# Patient Record
Sex: Male | Born: 1965
Health system: Southern US, Community
[De-identification: ages and names within clinical notes are randomized; demographics above are authoritative.]

## PROBLEM LIST (undated history)

## (undated) DIAGNOSIS — K219 Gastro-esophageal reflux disease without esophagitis: Secondary | ICD-10-CM

## (undated) DIAGNOSIS — E7800 Pure hypercholesterolemia, unspecified: Secondary | ICD-10-CM

## (undated) DIAGNOSIS — G4730 Sleep apnea, unspecified: Secondary | ICD-10-CM

## (undated) DIAGNOSIS — E785 Hyperlipidemia, unspecified: Secondary | ICD-10-CM

## (undated) HISTORY — DX: Hyperlipidemia, unspecified: E78.5

## (undated) HISTORY — PX: NO PAST SURGERIES: SHX2092

---

## 1998-09-12 ENCOUNTER — Ambulatory Visit: Admission: RE | Admit: 1998-09-12 | Discharge: 1998-09-12 | Payer: Self-pay | Admitting: Otolaryngology

## 2005-09-17 ENCOUNTER — Ambulatory Visit (HOSPITAL_COMMUNITY): Admission: RE | Admit: 2005-09-17 | Discharge: 2005-09-17 | Payer: Self-pay | Admitting: General Surgery

## 2007-07-29 ENCOUNTER — Ambulatory Visit (HOSPITAL_BASED_OUTPATIENT_CLINIC_OR_DEPARTMENT_OTHER): Admission: RE | Admit: 2007-07-29 | Discharge: 2007-07-29 | Payer: Self-pay | Admitting: Internal Medicine

## 2007-08-05 ENCOUNTER — Ambulatory Visit: Payer: Self-pay | Admitting: Internal Medicine

## 2007-10-11 ENCOUNTER — Ambulatory Visit (HOSPITAL_BASED_OUTPATIENT_CLINIC_OR_DEPARTMENT_OTHER): Admission: RE | Admit: 2007-10-11 | Discharge: 2007-10-11 | Payer: Self-pay | Admitting: Internal Medicine

## 2007-10-16 ENCOUNTER — Ambulatory Visit: Payer: Self-pay | Admitting: Internal Medicine

## 2008-05-08 ENCOUNTER — Encounter: Admission: RE | Admit: 2008-05-08 | Discharge: 2008-05-08 | Payer: Self-pay | Admitting: General Surgery

## 2008-12-30 ENCOUNTER — Inpatient Hospital Stay (HOSPITAL_COMMUNITY): Admission: EM | Admit: 2008-12-30 | Discharge: 2009-01-02 | Payer: Self-pay | Admitting: Family Medicine

## 2008-12-30 ENCOUNTER — Ambulatory Visit: Payer: Self-pay | Admitting: Family Medicine

## 2009-01-01 ENCOUNTER — Encounter: Payer: Self-pay | Admitting: Family Medicine

## 2009-11-13 ENCOUNTER — Emergency Department (HOSPITAL_COMMUNITY): Admission: EM | Admit: 2009-11-13 | Discharge: 2009-11-13 | Payer: Self-pay | Admitting: Emergency Medicine

## 2010-10-20 LAB — COMPREHENSIVE METABOLIC PANEL
ALT: 120 U/L — ABNORMAL HIGH (ref 0–53)
ALT: 91 U/L — ABNORMAL HIGH (ref 0–53)
AST: 48 U/L — ABNORMAL HIGH (ref 0–37)
AST: 51 U/L — ABNORMAL HIGH (ref 0–37)
Albumin: 3.2 g/dL — ABNORMAL LOW (ref 3.5–5.2)
Alkaline Phosphatase: 66 U/L (ref 39–117)
Alkaline Phosphatase: 68 U/L (ref 39–117)
Alkaline Phosphatase: 72 U/L (ref 39–117)
BUN: 12 mg/dL (ref 6–23)
Calcium: 8.2 mg/dL — ABNORMAL LOW (ref 8.4–10.5)
Calcium: 8.6 mg/dL (ref 8.4–10.5)
Chloride: 102 mEq/L (ref 96–112)
Chloride: 104 mEq/L (ref 96–112)
Creatinine, Ser: 1.1 mg/dL (ref 0.4–1.5)
Creatinine, Ser: 1.16 mg/dL (ref 0.4–1.5)
Creatinine, Ser: 1.2 mg/dL (ref 0.4–1.5)
GFR calc Af Amer: >60 mL/min (ref >60)
GFR calc Af Amer: >60 mL/min (ref >60)
GFR calc Af Amer: >60 mL/min (ref >60)
GFR calc non Af Amer: >60 mL/min (ref >60)
GFR calc non Af Amer: >60 mL/min (ref >60)
Glucose, Bld: 121 mg/dL — ABNORMAL HIGH (ref 70–99)
Potassium: 3.5 mEq/L (ref 3.5–5.1)
Potassium: 3.9 mEq/L (ref 3.5–5.1)
Sodium: 136 mEq/L (ref 135–145)
Total Bilirubin: 0.6 mg/dL (ref 0.3–1.2)
Total Bilirubin: 0.7 mg/dL (ref 0.3–1.2)
Total Protein: 7.1 g/dL (ref 6.0–8.3)
Total Protein: 7.4 g/dL (ref 6.0–8.3)

## 2010-10-20 LAB — CBC
Hemoglobin: 12.8 g/dL — ABNORMAL LOW (ref 13.0–17.0)
MCHC: 34.2 g/dL (ref 30.0–36.0)
MCV: 86.6 fL (ref 78.0–100.0)
Platelets: 177 10*3/uL (ref 150–400)
RDW: 13 % (ref 11.5–15.5)
RDW: 13.1 % (ref 11.5–15.5)
WBC: 8.2 10*3/uL (ref 4.0–10.5)

## 2010-10-20 LAB — CULTURE, BLOOD (ROUTINE X 2)
Culture: NO GROWTH
Culture: NO GROWTH

## 2010-10-20 LAB — DIFFERENTIAL
Monocytes Absolute: 1 10*3/uL (ref 0.1–1.0)
Monocytes Relative: 12 % (ref 3–12)

## 2010-11-25 NOTE — H&P (Signed)
NAMECLIFORD, [name]                ACCOUNT NO.:  [id number]   MEDICAL RECORD NO.:  [id number]          PATIENT TYPE:  INP   LOCATION:  3310                         FACILITY:  [facility]   PHYSICIAN:  Nestor Ramp, MD        DATE OF BIRTH:  Apr 19, 1966   DATE OF ADMISSION:  12/30/2008  DATE OF DISCHARGE:                              HISTORY & PHYSICAL   PRIMARY CARE PHYSICIAN:  Clydie Braun L. Hal Hope, MD, at [facility].   CHIEF COMPLAINT:  Fever and rash.   HISTORY OF PRESENT ILLNESS:  This patient is a 45 year old gentleman  with past medical history of hyperlipidemia that presents with fever,  chills, and weakness.  He also endorses a 1-day history of diffuse rash  and left knee arthralgia.  The patient works outdoors, moving lawns but  has not noted any recent insect or tick bite.  Family endorses many  mosquitoes at home.  The patient complains of a headache with mild neck  stiffness.  He has no vision changes, no altered mental status, although  he says he feels slightly confused.  He does answer questions  appropriately, and his family says that he have not noted any mental  status changes.  He did have 1 episode of nonbilious, nonbloody vomit x1  yesterday.  He denies diarrhea, constipation, or urinary changes.  He  also denies abdominal pain.  The patient was seen at [facility] yesterday  and today, he was diagnosed with a possible pneumonia, although the  patient reports minimal cough.  The patient was admitted to [facility]  for likelihood of [facility] spotted fever.  He was started on doxy  yesterday at [facility] but returned today feeling slightly  worse and with an elevated temperature at 103.   PAST MEDICAL HISTORY:  Hyperlipidemia.   PAST SURGICAL HISTORY:  None.   MEDICATIONS:  Simvastatin.   ALLERGIES:  No known drug allergies.   SOCIAL HISTORY:  The patient lives in [place] with his family.  He  quit smoking 2 years ago, but prior to that, he did  smoke one-half pack  per day x20 years.  He admits to 4- to 5-beer per week and denies drug  use.   Family history is significant for hypertension.   REVIEW OF SYSTEMS:  The patient does endorse fever, chills, fatigue,  headache, cough, nausea, rash, arthralgias, and dizziness.  He denies  sore throat, ear pain, rhinorrhea, chest pain, edema, orthopnea,  dyspnea, wheezing, sputum production, vomiting, diarrhea, vision  changes, polyuria, and polydipsia.   PHYSICAL EXAMINATION:  VITAL SIGNS:  Temperature 100.2, pulse 90,  respirations 18, blood pressure 125/63, pulse ox 97% on 2 L.  GENERAL:  He is alert and oriented x3 in no apparent distress.  HEENT:  Normocephalic, atraumatic.  Pupils are equally round and  reactive to light.  Extraocular muscles are intact.  Oropharynx is pink  with no lesions.  Mucous membranes are moist.  NECK:  He does have full range of motion of his neck and it is supple.  CARDIOVASCULAR:  Regular rate and  rhythm.  No murmurs, rubs, or gallops.  LUNGS:  Mostly clear with slight bibasilar crackles.  ABDOMEN:  Soft, nontender, and nondistended with bowel sounds x4.  SKIN:  The patient does have a papular blanchable rash diffusely on his  back and lower extremities.  EXTREMITIES:  There is no cyanosis, clubbing, or edema.  He does have a  left knee swelling without effusion.  No crepitus.  NEURO:  Cranial nerves II through XII are intact.  No known focal  deficits.  MUSCULOSKELETAL:  Strength is normal.   LABORATORY DATA:  UA was negative for nitrites and leukocytes, did have  100 protein and trace ketones.  White blood cell count 6.8, hemoglobin  13.7, hematocrit 42.7, and platelets 207.   ASSESSMENT/PLAN:  This is a 45 year old previously healthy gentleman  with new onset of fever x4 days accompanied by rash, arthralgias,  headache x1 day, and possible tick exposure.  1. Fever, rash.  It is likely this is [facility] spotted fever      given the  patient's story of being outside cutting grass last week      and now with fever, chills, malaise, and rash.  We will admit him      for observation overnight for signs of worsening disease.  We will      treat him with IV doxycycline, IV fluids and Tylenol/ibuprofen for      pain and fever.  We will obtained blood cultures x2 and check chest      x-ray.  It is unlikely that the patient does have pneumonia, as he      has no productive cough and no white blood cell count.  We will      check a CBC with diff and a CMP in the morning.  2. Arthralgia, left greater than right.  We will monitor this as well      as check an ESR and CRP.  3. Hyperlipidemia.  To pull the patient's statin for now, as we do not      know his dose.  4. Epigastric pain after vomiting.  We will give the patient Protonix      40 mg p.o. b.i.d.  5. Fluids, electrolytes, nutrition and gastrointestinal.  The patient      endorses good appetite with no more nausea, so he will be able to      have a regular diet with maintenance IV fluids.  6. Prophylaxis.  Protonix and heparin.  7. Disposition.  Pending workup and improvement.      Helane Rima, MD  Electronically Signed      Nestor Ramp, MD  Electronically Signed    EW/MEDQ  D:  12/30/2008  T:  12/31/2008  Job:  [id number]

## 2010-11-25 NOTE — Procedures (Signed)
NAMEPETR, [name]                ACCOUNT NO.:  [id number]   MEDICAL RECORD NO.:  [id number]          PATIENT TYPE:  OUT   LOCATION:  SLEEP CENTER                 FACILITY[facility]  [facility]   PHYSICIAN:  Clinton D. Maple Hudson, MD, FCCP, FACPDATE OF BIRTH:  09[birth year]Sep[birth year]1967   DATE OF STUDY:  07/29/2007                            NOCTURNAL POLYSOMNOGRAM   REFERRING PHYSICIAN:  Peyton Najjar, MD   INDICATION FOR STUDY:  Hypersomnia with sleep apnea.   EPWORTH SLEEPINESS SCORE:  6/24, BMI 27, weight 180 pounds, height 68  inches, neck 16 inches.   MEDICATIONS:  Home medication charted and reviewed.   SLEEP ARCHITECTURE:  Total sleep time 386 minutes.  Sleep efficiency  96.5%.  Stage I was 1.9%, stage II 61.7%, stage III absent, REM 36.4% of  total sleep time.  Sleep latency 9 minutes, REM latency 3 minutes, awake  after sleep onset 2 minutes, arousal index 13.8.  No bedtime medication  was taken.   RESPIRATORY DATA:  Diagnostic NPSG protocol.  Apnea/hypopnea index (AHI)  62.8 events per hour indicating severe obstructive sleep apnea/hypopnea  syndrome.  A total of 404 respiratory events were scored including 230  obstructive apneas, 17 central apneas, 22 mixed apneas and 135  hypopneas.  Events were not positional.  REM AHI 57.7.  CPAP titration  was not done as per diagnostic NPSG protocol.   OXYGEN DATA:  Loud snoring with oxygen desaturation to a nadir of 70%.  Mean oxygen saturation through the study was 92.5% on room air.   CARDIAC DATA:  Rhythm was frequent PACs.   MOVEMENT-PARASOMNIA:  No significant movement disturbance.  No bathroom  trips.   IMPRESSIONS-RECOMMENDATIONS:  1. Severe obstructive sleep apnea/hypopnea syndrome, AHI 62.8 per hour      with nonpositional events, loud snoring and oxygen desaturation to      a nadir of 70%.  2. Consider early return for CPAP titration or evaluate for      alternative therapies as appropriate.     Clinton D. Maple Hudson, MD, [facility], FACP  Diplomate, Biomedical engineer of Sleep Medicine  Electronically Signed    CDY/MEDQ  D:  08/06/2007 10:46:23  T:  08/07/2007 06:46:10  Job:  [id number]

## 2010-11-28 NOTE — Procedures (Signed)
[name], [name]                ACCOUNT NO.:  [id number]   MEDICAL RECORD NO.:  [id number]          PATIENT TYPE:  OUT   LOCATION:  SLEEP CENTER                 FACILITY:  [facility]   PHYSICIAN:  Clinton D. Maple Hudson, MD, FCCP, FACPDATE OF BIRTH:  [birth year]07[birth year]1967   DATE OF STUDY:                            NOCTURNAL POLYSOMNOGRAM   REFERRING PHYSICIAN:  Peyton Najjar, MD   INDICATION FOR STUDY:  Hypersomnia with sleep apnea.   EPWORTH SLEEPINESS SCORE:  4/24.   BMI 29.6.  Weight 189 pounds.  Height 67 inches.  Neck 16 inches.   HOME MEDICATIONS:  Charted and reviewed.   A baseline diagnostic NPSG on July 29, 2007 recorded an AHI of 62.8  per hour.  CPAP titration is requested.   SLEEP ARCHITECTURE:  Total sleep time 335 minutes with sleep efficiency  79.9%.  Stage I was 16.1%.  Stage II 71.9%.  Stage III absent.  REM  11.9% of total sleep time.  Sleep latency 13.5 minutes.  REM latency 190  minutes.  Awake after sleep onset 72.5 minutes.  Arousal index 14.1.  No bedtime medication was taken.   RESPIRATORY DATA:  CPAP titration protocol.  CPAP was titrated to 14  CWP, AHI 3.3 per hour.  He chose a medium Quattro full-face mask with  heated humidifier.   OXYGEN DATA:  Mean oxygen saturation was held at 97.6% on room air with  CPAP.   CARDIAC DATA:  Normal sinus rhythm.   MOVEMENT-PARASOMNIA:  No significant movement disturbance.  No bathroom  trips.   IMPRESSIONS-RECOMMENDATIONS:  1. Successful CPAP titration to 14 CWP, AHI 3.3 per hour.  He chose a      medium Quattro full-face mask with      heated humidifier.  2. Baseline diagnostic NPSG on July 29, 2007 recorded an AHI of      62.8 per hour.      Clinton D. Maple Hudson, MD, [facility], FACP  Diplomate, Biomedical engineer of Sleep Medicine  Electronically Signed     CDY/MEDQ  D:  10/15/2007 14:23:15  T:  10/15/2007 15:23:14  Job:  [id number]

## 2010-11-28 NOTE — Discharge Summary (Signed)
NAMESANDFORD, DIOP                ACCOUNT NO.:  [id number]   MEDICAL RECORD NO.:  [id number]          PATIENT TYPE:  INP   LOCATION:  2024                         FACILITY:  [facility]   PHYSICIAN:  Paula Compton, MD        DATE OF BIRTH:  1966/06/28   DATE OF ADMISSION:  12/30/2008  DATE OF DISCHARGE:  01/02/2009                               DISCHARGE SUMMARY   PRIMARY CARE PHYSICIAN:  Clydie Braun L. Hal Hope, MD, at [facility].   DISCHARGE DIAGNOSES:  1. [facility] spotted fever.  2. Hyperlipidemia.   DISCHARGE MEDICATIONS:  1. Doxycycline 100 mg by mouth twice daily, finish 10-day course.  2. Simvastatin previous dose.  3. The patient was also instructed to take Tylenol and ibuprofen for      fever over 100.4.   IMAGES:  On January 01, 2009, impression, no focal infiltrate or effusion.  The heart is in within normal limits in size.  No significant  degenerative changes seen.   LABORATORY DATA:  Blood cultures x2 showed no growth x5 days.  BMP  within normal limits with the exception of slightly elevated AST at 51  and ALT at 79.  Hemoglobin 12.8, hematocrit 38.5, CRP 13.1, BNP less  than 30, and ESR 41.   An echo on January 01, 2009, systolic function normal with an estimated  ejection fraction of 60-65%.   BRIEF HOSPITAL COURSE:  The patient is a pleasant 45 year old gentleman  with a past medical history significant for hyperlipidemia that  presented with fever, chills, weakness, and a diffuse rash consistent  with [facility] spotted fever.  He was admitted due to worsening  fever and symptoms and although he had already been taking doxycycline.  Please see H and P for further details.   [facility] spotted fever.  The patient's history and physical were  consistent with [facility] spotted fever.  The patient was admitted  for treatment with IV doxycycline, IV fluids, and Tylenol/ibuprofen for  pain and fever.  Blood cultures were obtained that showed no growth  x5  days, and a chest x-ray was obtained to evaluate for the possibility of  a pneumonia.  The initial chest x-ray on December 30, 2008, did show  cardiomegaly and a left lower lobe infiltrate.  Because of this, the  patient was also treated with ceftriaxone in case he did have a  unrelated pneumonia on top of his [facility] spotted fever.  He also  underwent an echo that showed a normal ejection fraction.  Subsequent  chest x-ray on January 01, 2009, showed no focal infiltrate or effusion and  also showed that the heart was within normal limits in size.  The  patient did continue to have fever spikes up to 103 prior to discharge,  but he was clinically feeling and looking much improved.  He was  educated about the natural course of [facility] spotted fever and  the fact that he may continue to have fevers for several more days.  He  was instructed to continue his course of doxycycline and to treat  his  fever with Tylenol and ibuprofen.  Prior to admission, it was noted that  his rash was improving and that he was taking good p.o. and admitting  that he felt much improved.  A punch biopsy had been planned to confirm  [facility] spotted fever; however, because the patient's rash was  improving so quickly, the punch biopsy did not deem necessary.  It was  also noted Dr. Hal Hope at [facility] did obtain an IgM to  confirm [facility] spotted fever.   FOLLOWUP:  The patient was instructed to follow up with Dr. Hal Hope at  [facility] in 1 week, and he was also given red flags that would  prompt and seek medical attention quickly.      Helane Rima, MD  Electronically Signed      Paula Compton, MD  Electronically Signed    EW/MEDQ  D:  01/06/2009  T:  01/07/2009  Job:  [id number]

## 2011-07-28 ENCOUNTER — Ambulatory Visit (INDEPENDENT_AMBULATORY_CARE_PROVIDER_SITE_OTHER): Payer: BC Managed Care – PPO

## 2011-07-28 DIAGNOSIS — R079 Chest pain, unspecified: Secondary | ICD-10-CM

## 2011-07-28 DIAGNOSIS — R0602 Shortness of breath: Secondary | ICD-10-CM

## 2011-07-28 DIAGNOSIS — R635 Abnormal weight gain: Secondary | ICD-10-CM

## 2011-07-28 DIAGNOSIS — F809 Developmental disorder of speech and language, unspecified: Secondary | ICD-10-CM

## 2011-11-30 ENCOUNTER — Ambulatory Visit (INDEPENDENT_AMBULATORY_CARE_PROVIDER_SITE_OTHER): Payer: BC Managed Care – PPO | Admitting: Family Medicine

## 2011-11-30 VITALS — BP 126/80 | HR 63 | Temp 98.0°F | Resp 16 | Ht 67.5 in | Wt 193.0 lb

## 2011-11-30 DIAGNOSIS — Z0000 Encounter for general adult medical examination without abnormal findings: Secondary | ICD-10-CM

## 2011-11-30 DIAGNOSIS — E785 Hyperlipidemia, unspecified: Secondary | ICD-10-CM

## 2011-11-30 LAB — COMPREHENSIVE METABOLIC PANEL WITH GFR
ALT: 34 U/L (ref 0–53)
AST: 22 U/L (ref 0–37)
Albumin: 4.8 g/dL (ref 3.5–5.2)
BUN: 13 mg/dL (ref 6–23)
Calcium: 9.8 mg/dL (ref 8.4–10.5)
Total Protein: 7.8 g/dL (ref 6.0–8.3)

## 2011-11-30 LAB — LIPID PANEL
Cholesterol: 137 mg/dL (ref 0–200)
HDL: 45 mg/dL (ref >39)
LDL Cholesterol: 72 mg/dL (ref 0–99)
Total CHOL/HDL Ratio: 3 Ratio
Triglycerides: 100 mg/dL (ref <150)
VLDL: 20 mg/dL (ref 0–40)

## 2011-11-30 LAB — POCT CBC
Granulocyte percent: 79.2 % (ref 37–80)
HCT, POC: 42.9 % — AB (ref 43.5–53.7)
Hemoglobin: 14.5 g/dL (ref 14.1–18.1)
Lymph, poc: 1.5 (ref 0.6–3.4)
MCH, POC: 29.1 pg (ref 27–31.2)
MCHC: 33.8 g/dL (ref 31.8–35.4)
MCV: 86 fL (ref 80–97)
MID (cbc): 0.5 (ref 0–0.9)
MPV: 10.2 fL (ref 0–99.8)
POC Granulocyte: 7.7 — AB (ref 2–6.9)
POC LYMPH PERCENT: 15.2 % (ref 10–50)
POC MID %: 5.6 % (ref 0–12)
Platelet Count, POC: 240 10*3/uL (ref 142–424)
RBC: 4.99 M/uL (ref 4.69–6.13)
RDW, POC: 14.1 %
WBC: 9.7 10*3/uL (ref 4.6–10.2)

## 2011-11-30 LAB — COMPREHENSIVE METABOLIC PANEL
Alkaline Phosphatase: 62 U/L (ref 39–117)
CO2: 28 mEq/L (ref 19–32)
Chloride: 106 mEq/L (ref 96–112)
Creat: 1.13 mg/dL (ref 0.50–1.35)
Glucose, Bld: 82 mg/dL (ref 70–99)
Potassium: 4.7 mEq/L (ref 3.5–5.3)
Sodium: 141 mEq/L (ref 135–145)
Total Bilirubin: 0.5 mg/dL (ref 0.3–1.2)

## 2011-11-30 LAB — IFOBT (OCCULT BLOOD): IFOBT: NEGATIVE

## 2011-11-30 MED ORDER — SIMVASTATIN 20 MG PO TABS: 20.0000 mg | ORAL_TABLET | Freq: Every evening | ORAL | Status: DC

## 2011-11-30 NOTE — Progress Notes (Signed)
[facility]:  Office Visit  Chief Complaint:  Chief Complaint  Patient presents with  . Heartburn    x years  . Constipation  . Annual Exam    Snores, Apnea    HPI: [name] is a 46 y.o. male who complains of: 1. CPE-doing well 2. Sleep apnea-using CPAP 3. GERD not on meds 4. Hyperlipidemia-takes medications regularly, no SEs   Past Medical History  Diagnosis Date  . Hyperlipidemia    History reviewed. No pertinent past surgical history. History   Social History  . Marital Status: Married    Spouse Name: N/A    Number of Children: N/A  . Years of Education: N/A   Social History Main Topics  . Smoking status: Former Smoker    Quit date: 11/30/2006  . Smokeless tobacco: None  . Alcohol Use: No  . Drug Use: No  . Sexually Active: None   Other Topics Concern  . None   Social History Narrative  . None   Family History  Problem Relation Age of Onset  . Thyroid disease Mother   . Hyperlipidemia Father   . Hypertension Father   . Stroke Father    No Known Allergies Prior to Admission medications   Medication Sig Start Date End Date Taking? Authorizing Provider  simvastatin (ZOCOR) 20 MG tablet Take 20 mg by mouth every evening.   Yes Historical Provider, MD     ROS: The patient denies fevers, chills, night sweats, unintentional weight loss, chest pain, palpitations, wheezing, dyspnea on exertion, nausea, vomiting, abdominal pain, dysuria, hematuria, melena, numbness, weakness, or tingling.  All other systems have been reviewed and were otherwise negative with the exception of those mentioned in the HPI and as above.    PHYSICAL EXAM: Filed Vitals:   11/30/11 0928  BP: 126/80  Pulse: 63  Temp: 98 F (36.7 C)  Resp: 16   Filed Vitals:   11/30/11 0928  Height: 5' 7.5" (1.715 m)  Weight: 193 lb (87.544 kg)   Body mass index is 29.78 kg/(m^2).  General: Alert, no acute distress HEENT:  Normocephalic, atraumatic, oropharynx  patent. Tm nl, EOMI, PERRLA, Fundoscopic exam nl Cardiovascular:  Regular rate and rhythm, no rubs murmurs or gallops.  No Carotid bruits, radial pulse intact. No pedal edema.  Respiratory: Clear to auscultation bilaterally.  No wheezes, rales, or rhonchi.  No cyanosis, no use of accessory musculature GI: No organomegaly, abdomen is soft and non-tender, positive bowel sounds.  No masses. Skin: No rashes. Neurologic: Facial musculature symmetric. Psychiatric: Patient is appropriate throughout our interaction. Lymphatic: No cervical lymphadenopathy Musculoskeletal: Gait intact. GU-normal testes, scrotum, prostate; no rashes or lesions   LABS: Results for orders placed in visit on 11/30/11  POCT CBC      Component Value Range   WBC 9.7  4.6 - 10.2 (K/uL)   Lymph, poc 1.5  0.6 - 3.4    POC LYMPH PERCENT 15.2  10 - 50 (%L)   MID (cbc) 0.5  0 - 0.9    POC MID % 5.6  0 - 12 (%M)   POC Granulocyte 7.7 (*) 2 - 6.9    Granulocyte percent 79.2  37 - 80 (%G)   RBC 4.99  4.69 - 6.13 (M/uL)   Hemoglobin 14.5  14.1 - 18.1 (g/dL)   HCT, POC 09.8 (*) 11.9 - 53.7 (%)   MCV 86.0  80 - 97 (fL)   MCH, POC 29.1  27 - 31.2 (pg)  MCHC 33.8  31.8 - 35.4 (g/dL)   RDW, POC 16.1     Platelet Count, POC 240  142 - 424 (K/uL)   MPV 10.2  0 - 99.8 (fL)  LIPID PANEL      Component Value Range   Cholesterol 137  0 - 200 (mg/dL)   Triglycerides 096  <045 (mg/dL)   HDL 45  >40 (mg/dL)   Total CHOL/HDL Ratio 3.0     VLDL 20  0 - 40 (mg/dL)   LDL Cholesterol 72  0 - 99 (mg/dL)  TSH      Component Value Range   TSH 1.597  0.350 - 4.500 (uIU/mL)  COMPREHENSIVE METABOLIC PANEL      Component Value Range   Sodium 141  135 - 145 (mEq/L)   Potassium 4.7  3.5 - 5.3 (mEq/L)   Chloride 106  96 - 112 (mEq/L)   CO2 28  19 - 32 (mEq/L)   Glucose, Bld 82  70 - 99 (mg/dL)   BUN 13  6 - 23 (mg/dL)   Creat 9.81  1.91 - 4.78 (mg/dL)   Total Bilirubin 0.5  0.3 - 1.2 (mg/dL)   Alkaline Phosphatase 62  39 - 117 (U/L)     AST 22  0 - 37 (U/L)   ALT 34  0 - 53 (U/L)   Total Protein 7.8  6.0 - 8.3 (g/dL)   Albumin 4.8  3.5 - 5.2 (g/dL)   Calcium 9.8  8.4 - 29.5 (mg/dL)  IFOBT (OCCULT BLOOD)      Component Value Range   IFOBT Negative       EKG/XRAY:   Primary read interpreted by Dr. Conley Rolls at [facility].   ASSESSMENT/PLAN: Encounter Diagnoses  Name Primary?  . Annual physical exam Yes  . Hyperlipidemia    Fasting labs pending GERD info given and recommend OTC PRilosec F/u in 6 months for Hyperlipidemia    Craig Lafrance PHUONG, DO 12/02/2011 10:07 AM

## 2011-12-01 LAB — TSH: TSH: 1.597 u[IU]/mL (ref 0.350–4.500)

## 2011-12-16 ENCOUNTER — Telehealth: Payer: Self-pay

## 2011-12-16 NOTE — Telephone Encounter (Signed)
LMOM to CB. 

## 2011-12-16 NOTE — Telephone Encounter (Signed)
All labs are normal. 

## 2011-12-16 NOTE — Telephone Encounter (Signed)
Pt has not received results of tests from his appointment second request, please call

## 2011-12-16 NOTE — Telephone Encounter (Signed)
Can we review labs from 5/20

## 2011-12-17 NOTE — Telephone Encounter (Signed)
Lab results mailed to patient. Unable to reach by phone [name]

## 2011-12-17 NOTE — Telephone Encounter (Signed)
Lmvm to call back Kellan Boehlke

## 2012-02-01 ENCOUNTER — Encounter: Payer: Self-pay | Admitting: Family Medicine

## 2012-04-07 ENCOUNTER — Ambulatory Visit (INDEPENDENT_AMBULATORY_CARE_PROVIDER_SITE_OTHER): Payer: BC Managed Care – PPO | Admitting: Emergency Medicine

## 2012-04-07 VITALS — BP 122/74 | HR 49 | Temp 97.4°F | Resp 17 | Ht 67.0 in | Wt 189.0 lb

## 2012-04-07 DIAGNOSIS — G5600 Carpal tunnel syndrome, unspecified upper limb: Secondary | ICD-10-CM

## 2012-04-07 MED ORDER — NAPROXEN SODIUM 550 MG PO TABS: 550.0000 mg | ORAL_TABLET | Freq: Two times a day (BID) | ORAL | Status: AC

## 2012-04-07 NOTE — Progress Notes (Signed)
[facility] [address] [place] [address] [id number]  Date:  04/07/2012   Name:  [name]   DOB:  09-Nov-1965   MRN:  [id number]  PCP:  No primary provider on file.    Chief Complaint: Hand Injury   History of Present Illness:  [name] is a 46 y.o. very pleasant male patient who presents with the following:  Has 1 week or so duration of pain and numbness in median nerve distribution of right hand.  Says that with increased use of hand he has more symptoms of numbness.  Awakens at night with pain and numbness of his right hand.  Denies any history of injury.  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  Denies injury.  There is no problem list on file for this patient.   Past Medical History  Diagnosis Date  . Hyperlipidemia     No past surgical history on file.  History  Substance Use Topics  . Smoking status: Former Smoker    Quit date: 11/30/2006  . Smokeless tobacco: Not on file  . Alcohol Use: No    Family History  Problem Relation Age of Onset  . Thyroid disease Mother   . Hyperlipidemia Father   . Hypertension Father   . Stroke Father     No Known Allergies  Medication list has been reviewed and updated.  Current Outpatient Prescriptions on File Prior to Visit  Medication Sig Dispense Refill  . simvastatin (ZOCOR) 20 MG tablet Take 1 tablet (20 mg total) by mouth every evening.  30 tablet  5    Review of Systems:  As per HPI, otherwise negative.    Physical Examination: Filed Vitals:   04/07/12 1605  BP: 122/74  Pulse: 49  Temp: 97.4 F (36.3 C)  Resp: 17   Filed Vitals:   04/07/12 1605  Height: 5\' 7"  (1.702 m)  Weight: 189 lb (85.73 kg)   Body mass index is 29.60 kg/(m^2). Ideal Body Weight: Weight in (lb) to have BMI = 25: 159.3    GEN: WDWN, NAD, Non-toxic, Alert & Oriented x 3 HEENT: Atraumatic,  Normocephalic.  Ears and Nose: No external deformity. EXTR: No clubbing/cyanosis/edema NEURO: Normal gait.  PSYCH: Normally interactive. Conversant. Not depressed or anxious appearing.  Calm demeanor.  Right hand:  Tinnel positive  Phalen positive.  No thenar wasting.  Normal strength   Assessment and Plan: Carpal tunnel syndrome Splint at night Anaprox  Follow up in two weeks if not improved.  Carmelina Dane, MD  I have reviewed and agree with documentation. Robert P. Merla Riches, M.D.

## 2012-04-07 NOTE — Patient Instructions (Addendum)
H?i Ch?ng Rnh Kh?i X??ng C? Tay (Carpal Tunnel Syndrome) ?ng quay c? tay l m?t vng r?ng v h?p trong c? tay. ?ng ny ???c hnh thnh b?i x??ng c? tay v cc dy ch?ng. Cc dy th?n kinh, m?ch mu v gn ? pha bn lng bn tay c?a b?n (cc ngn tay c?a b?n g?p l?i theo h??ng ? bn tay ?) ?i qua ?ng quay c? tay. C? ??ng c? tay l?p ?i l?p l?i ho?c m?t s? b?nh c th? d?n ??n hi?n t??ng s?ng trong ?ng. (? l nguyn nhn t?i sao nh?ng b?nh ny ???c g?i l r?i lo?n ch?n th??ng l?p l?i. ?y c?ng l b?nh th??ng g?p trong giai ?o?n mang [place] g?n ??n ngy sinh). Ch? s?ng ny ? ln dy th?n kinh chnh trong c? tay (dy th?n kinh gi?a) v gy ra hi?n t??ng ?au g?i l h?i ch?ng ?ng x??ng c? tay (carpal tunnel syndrome). C th? nh?n th?y c?m gic "?au nh? kim chm" ? tay v cc ngn tay; tuy nhin, ton b? cnh tay c?ng c th? b? ?au do h?i ch?ng ny. H?i ch?ng ?ng x??ng c? tay c th? t? h?t. Tim cortisone c?ng c th? c tc d?ng. ?i khi, c th? ph?i ph?u thu?t ?? gi?i phng dy th?n kinh b? ?. C th? ph?i lm c? ?i?n ?? (m?t ki?u xt nghi?m) ?? kh?ng ??nh l?i k?t qu? chu?n ?on ny. ?y l xt nghi?m ?o kh? n?ng d?n c?a dy th?n kinh. Khi b? h?i ch?ng ?ng x??ng c? tay, kh? n?ng truy?n d?n c?a dy th?n kinh th??ng b? ch?m ?i. H??NG D?N CH?M [place] T?I NH  N?u bc s? c?a b?n c k thu?c ?? gip b?t s?ng, hy u?ng thu?c theo ch? d?n.   N?u b?n ???c c?p thanh n?p ?? gi? cho c? tay b?n khng b? g?p l?i, hy s? d?ng thanh n?p theo nh? h??ng d?n. Nn ?eo thanh n?p vo ban ?m. Dng thanh n?p cho ??n khi b?n h?t ?au ho?c t ? bn tay, cnh tay ho?c c? tay. C th? s? ph?i m?t t? 1 ??n 2 thng.   N?u b?n b? ?au vo ban ?m, Ch xt ho?c l?c bn tay, ho?c nng cao bn tay c?a b?n cao h?n v? tr c?a tim (gi?a ng?c b?n) c?ng c th? gip b?t ?au.   ?i?u quan tr?ng l ?? c? tay b?n ???c ngh? ng?i b?ng cch ng?ng cc ho?t ??ng l nguyn nhn d?n ??n b?nh. N?u cc tri?u ch?ng c?a b?n lin quan ??n cng vi?c, b?n c th? ph?i  ni chuy?n v?i ng??i s? d?ng lao ??ng c?a b?n v? vi?c chuy?n sang cng vi?c khng c?n s? d?ng c? tay c?a b?n.   Ch? dng cc thu?c ???c bn khng c?n ??n thu?c c?a Bc s? ho?c theo toa c?a Bc s? ?? gi?m ?au, kh ch?u, hay s?t theo nh? h??ng d?n c?a Bc s?.   Sau nh?ng lc lm vi?c qulu, ??c bi?t l khi s? d?ng c? tay nhi?u, hy ??t ti ? l?nh c b?c trong m?t chi?c kh?n ln pha m?t tr??c (lng bn tay) n?i c c? tay b? t?n th??ng t? 20-30 pht. Khi c?n l?p l?i t? ba ??n b?n l?n m?t ngy. Cch ny s? gip b?t s?ng.   Lm theo cc h??ng d?n ?? theo di v?i ng??i ch?m [place] c?a b?n. ?i?u ny bao g?m b?t k? gi?i thi?u ch?nh hnh, v?t l tr? li?u, v ph?c h?i ch?c n?ng no. B?t c? s?  tr hon no trong vi?c nh?n ???c s? ch?m [place] c?n thi?t c th? d?n ??n ch?m tr? hay th?t b?i trong vi?c ch?a lnh vim ti thanh m?c v ?au mn tnh.  HY ??N KHM B?NH NGAY L?P T?C N?U:  B?n v?n b? ?au v t sau m?t tu?n ?i?u tr?Marland Kitchen   B?n b? nh?ng tri?u ch?ng m?i, khng r nguyn nhn.   Cc tri?u ch?ng b?n ?ang c ngy cng tr?m tr?ng h?n v cc lo?i thu?c khng c tc d?ng ho?c khng ki?m sot ???c.  HY CH?C CH?N R?NG B?N:  Hi?u r nh?ng h??ng d?n khi xu?t vi?n.   S? theo di tnh tr?ng b?nh c?a b?n.   S? ??n khm b?nh ngay l?p t?c nh? ? ???c h??ng d?n.  Document Released: 06/29/2005 Document Revised: 06/18/2011 [employer] Patient Information 2012 [place], [place].

## 2012-04-29 ENCOUNTER — Encounter: Payer: Self-pay | Admitting: Physician Assistant

## 2012-04-29 DIAGNOSIS — G5603 Carpal tunnel syndrome, bilateral upper limbs: Secondary | ICD-10-CM | POA: Insufficient documentation

## 2014-04-27 ENCOUNTER — Encounter: Payer: Self-pay | Admitting: Family Medicine

## 2014-04-27 ENCOUNTER — Ambulatory Visit (INDEPENDENT_AMBULATORY_CARE_PROVIDER_SITE_OTHER): Payer: BC Managed Care – PPO | Admitting: Family Medicine

## 2014-04-27 VITALS — BP 110/70 | HR 53 | Temp 97.0°F | Resp 16 | Ht 67.5 in | Wt 213.0 lb

## 2014-04-27 DIAGNOSIS — Z0000 Encounter for general adult medical examination without abnormal findings: Secondary | ICD-10-CM

## 2014-04-27 DIAGNOSIS — Z1329 Encounter for screening for other suspected endocrine disorder: Secondary | ICD-10-CM

## 2014-04-27 DIAGNOSIS — Z130 Encounter for screening for diseases of the blood and blood-forming organs and certain disorders involving the immune mechanism: Secondary | ICD-10-CM

## 2014-04-27 DIAGNOSIS — E785 Hyperlipidemia, unspecified: Secondary | ICD-10-CM

## 2014-04-27 DIAGNOSIS — K219 Gastro-esophageal reflux disease without esophagitis: Secondary | ICD-10-CM

## 2014-04-27 DIAGNOSIS — Z131 Encounter for screening for diabetes mellitus: Secondary | ICD-10-CM

## 2014-04-27 LAB — CBC WITH DIFFERENTIAL/PLATELET
Basophils Absolute: 0 10*3/uL (ref 0.0–0.1)
Basophils Relative: 1 % (ref 0–1)
Eosinophils Absolute: 0.2 10*3/uL (ref 0.0–0.7)
Eosinophils Relative: 5 % (ref 0–5)
HCT: 42.8 % (ref 39.0–52.0)
Hemoglobin: 14.2 g/dL (ref 13.0–17.0)
Lymphocytes Relative: 39 % (ref 12–46)
Lymphs Abs: 1.9 10*3/uL (ref 0.7–4.0)
MCH: 28.1 pg (ref 26.0–34.0)
MCHC: 33.2 g/dL (ref 30.0–36.0)
MCV: 84.6 fL (ref 78.0–100.0)
Monocytes Absolute: 0.4 10*3/uL (ref 0.1–1.0)
Monocytes Relative: 8 % (ref 3–12)
Neutro Abs: 2.3 10*3/uL (ref 1.7–7.7)
Neutrophils Relative %: 47 % (ref 43–77)
Platelets: 232 10*3/uL (ref 150–400)
RBC: 5.06 MIL/uL (ref 4.22–5.81)
RDW: 13.9 % (ref 11.5–15.5)
WBC: 4.9 10*3/uL (ref 4.0–10.5)

## 2014-04-27 LAB — POCT GLYCOSYLATED HEMOGLOBIN (HGB A1C): Hemoglobin A1C: 5.6

## 2014-04-27 LAB — COMPLETE METABOLIC PANEL WITHOUT GFR
ALT: 31 U/L (ref 0–53)
Albumin: 4.6 g/dL (ref 3.5–5.2)
BUN: 12 mg/dL (ref 6–23)
CO2: 28 meq/L (ref 19–32)
Chloride: 105 meq/L (ref 96–112)
Creat: 1.07 mg/dL (ref 0.50–1.35)
GFR, Est African American: >89 mL/min
Glucose, Bld: 86 mg/dL (ref 70–99)
Potassium: 4 meq/L (ref 3.5–5.3)
Sodium: 139 meq/L (ref 135–145)
Total Protein: 7.9 g/dL (ref 6.0–8.3)

## 2014-04-27 LAB — LIPID PANEL
Cholesterol: 146 mg/dL (ref 0–200)
HDL: 42 mg/dL (ref >39)
LDL Cholesterol: 71 mg/dL (ref 0–99)
Total CHOL/HDL Ratio: 3.5 Ratio
Triglycerides: 165 mg/dL — ABNORMAL HIGH (ref <150)
VLDL: 33 mg/dL (ref 0–40)

## 2014-04-27 LAB — COMPLETE METABOLIC PANEL WITH GFR
AST: 22 U/L (ref 0–37)
Alkaline Phosphatase: 64 U/L (ref 39–117)
Calcium: 9.6 mg/dL (ref 8.4–10.5)
GFR, Est Non African American: 83 mL/min
Total Bilirubin: 0.6 mg/dL (ref 0.2–1.2)

## 2014-04-27 LAB — TSH: TSH: 1.865 u[IU]/mL (ref 0.350–4.500)

## 2014-04-27 MED ORDER — SIMVASTATIN 20 MG PO TABS: 20.0000 mg | ORAL_TABLET | Freq: Every evening | ORAL | Status: DC

## 2014-04-27 NOTE — Patient Instructions (Signed)
Take omeprazole 20 mg daily as needed for gerd, try for 1 month daily and see if improves   B?nh Tro Ng??c D? Dy Th?c Qu?n, Ng??i L?n (Gastroesophageal Reflux Diseaes, Adult) B?nh tro ng??c d? dy th?c qu?n (GERD) x?y ra khi axit t? d? dy tro ln th?c qu?n. Khi axit ti?p xc v?i th?c qu?n, axit gy ra ?au (vim) trong th?c qu?n. Theo th?i gian, GERD c th? t?o ra cc l? nh? (cc v?t lot) ? nim m?c th?c qu?n.  NGUYN NHN  Tr?ng l??ng c? th? t?ng. ?i?u ny t?o p l?c ln d? dy, lm t?ng axit t? d? dy vo th?c qu?n.  Ht thu?c l. Ht thu?c l lm t?ng s?n sinh axit trong d? dy.  U?ng r??u. ?y l nguyn nhn lm gi?m p l?c trong c? th?t th?c qu?n d??i (van ho?c vng c? gi?a th?c qu?n v d? dy), cho php axit t? d? dy vo th?c qu?n.  ?n t?i mu?n v b?ng no. Tnh tr?ng ny lm t?ng p l?c c?ng nh? t?ng s?n sinh axit trong d? dy.  D? t?t c? th?t th?c qu?n d??i. ?i khi khng tm th?y nguyn nhn. TRI?U CH?NG  ?au rt ? ph?n d??i gi?a ng?c pha sau x??ng ?c v ? khu v?c gi?a d? dy. Hi?n t??ng ny c th? x?y ra hai l?n m?t tu?n ho?c th??ng xuyn h?n.  Kh nu?t.  ?au h?ng.  Ho khan.  Cc tri?u ch?ng gi?ng hen suy?n, bao g?m t?c ng?c,kh th? ho?c th? kh kh. CH?N ?ON Chuyn gia ch?m [employer] s?c kh?e c th? ch?n ?on GERD d?a trn cc tri?u ch?ng c?a b?n. Trong m?t s? tr??ng h?p, ch?p X quang v cc xt nghi?m khc c th? ???c ti?n hnh ?? ki?m tra cc bi?n ch?ng ho?c tnh tr?ng c?a d? dy v th?c qu?n. ?I?U TR? Chuyn gia ch?m [place] s?c kh?e c th? khuy?n ngh? dng thu?c khng c?n k ??n ho?c thu?c c?n k ??n ?? gip gi?m s?n sinh axit. Hy h?i chuyn gia ch?m [place] s?c kh?e c?a b?n tr??c khi b?t ??u ho?c dng thm b?t k? lo?i thu?c m?i no. H??NG D?N CH?M [place] T?I NH  Thay ??i cc y?u t? m b?n c th? ki?m sot ???c. H?i chuyn gia ch?m [place] s?c kh?e ?? ???c h??ng d?n v? vi?c gi?m cn, b? thu?c l v s? d?ng r??u.  Hessie Diener cc lo?i th?c ph?m v ?? u?ng lm cho cc tri?u ch?ng t?i  t? h?n, ch?ng h?n nh?:  ?? u?ng c caffeine ho?c r??u.  S c la.  B?c h ho?c v? b?c h.  T?i v hnh ty.  Th?c ?n [place].  Tri cy h? cam, ch?ng h?n nh? cam, chanh hay chanh ty.  Cc th?c ?n c c chua, ch?ng h?n nh? n??c x?t, ?t, salsa (n??c x?t [place]) v bnh pizza.  Cc lo?i th?c ?n chin xo v nhi?u ch?t bo.  Trnh n?m xu?ng ng? 3 ti?ng tr??c gi? ?i ng? ho?c tr??c khi c m?t gi?c ng? ng?n.  ?n nh?ng b?a ?n nh?, th??ng xuyn h?n thay v cc b?a ?n l?n.  M?c qu?n o r?ng. Khng ?eo b?t c? th? g ch?t quanh th?t l?ng gy p l?c ln d? dy.  Nng ??u gi??ng cao ln t? 6 ??n 8 inch b?ng cc kh?i g? ?? gip b?n ng?. S? d?ng thm g?i s? khng c tc d?ng.  Ch? s? d?ng thu?c khng c?n k ??n ho?c thu?c c?n k ??n ?? gi?m ?au,  gi?m c?m gic kh ch?u ho?c h? s?t theo ch? d?n c?a chuyn gia ch?m [place] s?c kh?e c?a b?n.  Khng dng thu?c atpirin, ibuprofen ho?c cc thu?c ch?ng vim khng c steroid (NSAID) khc. HY NGAY L?P T?C ?I KHM N?U:  B?n b? ?au ? cnh tay, c?, hm, r?ng ho?c l?ng.  Hi?n t??ng ?au t?ng ln ho?c thay ??i theo c??ng ?? ho?c th?i gian.  B?n b? bu?n nn, nn ho?c ?? m? hi (tot m? hi).  B?n b? kh th? ho?c ng?t x?u.  Ch?t nn c mu xanh l cy, vng, ?en ho?c trng gi?ng nh? b c ph ho?c mu.  Phn c mu ??, ?? nh? mu ho?c ?en. Nh?ng tri?u ch?ng ny c th? l d?u hi?u c?a cc v?n ?? khc, ch?ng h?n nh? b?nh tim, ch?y mu d? dy ho?c ch?y mu th?c qu?n. ??M B?O B?N:  Hi?u cc h??ng d?n ny.  S? theo di tnh tr?ng c?a mnh.  S? yu c?u tr? gip ngay l?p t?c n?u b?n c?m th?y khng ?? ho?c tnh tr?ng tr?m tr?ng h?n. Document Released: 04/08/2005 Document Revised: 03/01/2013 [facility] Patient Information 2015 [place]. This information is not intended to replace advice given to you by your health care provider. Make sure you discuss any questions you have with your health care provider.

## 2014-04-27 NOTE — Progress Notes (Signed)
Chief Complaint:  Chief Complaint  Patient presents with  . Annual Exam    and MEDICATION REFILL - SIMVASTATIN    HPI: 35 [name] is a 48 y.o. male who is here for : CPE-no complaints, has ahd flu vaccine at work this month He has hyperlipidemia is compliant on meds but no SEs. HE state he has some numbens sin his right arm  But not all the time, he denies constant msk aches.  He has CPAP fopr OSA from 10-5  He is doing well overall.  He has not eaten.   He has had CP for a long time is is only with epigastric pain, he has had this for a long time,likes to eat spicy and acid food and makes it worse. Last echo was in 2010 for Cp workup HE drinks about 5-6 beers per weekend   Past Medical History  Diagnosis Date  . Hyperlipidemia    No past surgical history on file. History   Social History  . Marital Status: Married    Spouse Name: N/A    Number of Children: N/A  . Years of Education: N/A   Social History Main Topics  . Smoking status: Former Smoker    Quit date: 11/30/2006  . Smokeless tobacco: None  . Alcohol Use: No  . Drug Use: No  . Sexual Activity: None   Other Topics Concern  . None   Social History Narrative  . None   Family History  Problem Relation Age of Onset  . Thyroid disease Mother   . Hyperlipidemia Father   . Hypertension Father   . Stroke Father    No Known Allergies Prior to Admission medications   Medication Sig Start Date End Date Taking? Authorizing Provider  simvastatin (ZOCOR) 20 MG tablet Take 1 tablet (20 mg total) by mouth every evening. 11/30/11  Yes Alex Mcmanigal P Lynix Bonine, DO     ROS: The patient denies fevers, chills, night sweats, unintentional weight loss,  palpitations, wheezing, dyspnea on exertion, nausea, vomiting, abdominal pain, dysuria, hematuria, melena, numbness, weakness, or tingling.   All other systems have been reviewed and were otherwise negative with the exception of those mentioned in the HPI and as above.     PHYSICAL EXAM: Filed Vitals:   04/27/14 1009  BP: 110/70  Pulse: 53  Temp: 97 F (36.1 C)  Resp: 16   Filed Vitals:   04/27/14 1009  Height: 5' 7.5" (1.715 m)  Weight: 213 lb (96.616 kg)   Body mass index is 32.85 kg/(m^2).  General: Alert, no acute distress HEENT:  Normocephalic, atraumatic, oropharynx patent. EOMI, PERRLA, fundo , tm normal Cardiovascular:  Regular rate and rhythm, no rubs murmurs or gallops.  No Carotid bruits, radial pulse intact. No pedal edema.  Respiratory: Clear to auscultation bilaterally.  No wheezes, rales, or rhonchi.  No cyanosis, no use of accessory musculature GI: No organomegaly, abdomen is soft and non-tender, positive bowel sounds.  No masses. Skin: No rashes. Neurologic: Facial musculature symmetric. Psychiatric: Patient is appropriate throughout our interaction. Lymphatic: No cervical lymphadenopathy Musculoskeletal: Gait intact. GU-normal, neg hernia   LABS: Results for orders placed in visit on 11/30/11  LIPID PANEL      Result Value Ref Range   Cholesterol 137  0 - 200 mg/dL   Triglycerides 100  <150 mg/dL   HDL 45  >39 mg/dL   Total CHOL/HDL Ratio 3.0     VLDL 20  0 - 40 mg/dL  LDL Cholesterol 72  0 - 99 mg/dL  TSH      Result Value Ref Range   TSH 1.597  0.350 - 4.500 uIU/mL  COMPREHENSIVE METABOLIC PANEL      Result Value Ref Range   Sodium 141  135 - 145 mEq/L   Potassium 4.7  3.5 - 5.3 mEq/L   Chloride 106  96 - 112 mEq/L   CO2 28  19 - 32 mEq/L   Glucose, Bld 82  70 - 99 mg/dL   BUN 13  6 - 23 mg/dL   Creat 1.13  0.50 - 1.35 mg/dL   Total Bilirubin 0.5  0.3 - 1.2 mg/dL   Alkaline Phosphatase 62  39 - 117 U/L   AST 22  0 - 37 U/L   ALT 34  0 - 53 U/L   Total Protein 7.8  6.0 - 8.3 g/dL   Albumin 4.8  3.5 - 5.2 g/dL   Calcium 9.8  8.4 - 10.5 mg/dL  POCT CBC      Result Value Ref Range   WBC 9.7  4.6 - 10.2 K/uL   Lymph, poc 1.5  0.6 - 3.4   POC LYMPH PERCENT 15.2  10 - 50 %L   MID (cbc) 0.5  0 - 0.9   POC  MID % 5.6  0 - 12 %M   POC Granulocyte 7.7 (*) 2 - 6.9   Granulocyte percent 79.2  37 - 80 %G   RBC 4.99  4.69 - 6.13 M/uL   Hemoglobin 14.5  14.1 - 18.1 g/dL   HCT, POC 42.9 (*) 43.5 - 53.7 %   MCV 86.0  80 - 97 fL   MCH, POC 29.1  27 - 31.2 pg   MCHC 33.8  31.8 - 35.4 g/dL   RDW, POC 14.1     Platelet Count, POC 240  142 - 424 K/uL   MPV 10.2  0 - 99.8 fL  IFOBT (OCCULT BLOOD)      Result Value Ref Range   IFOBT Negative       EKG/XRAY:   Primary read interpreted by Dr. Marin Comment at [facility]   ASSESSMENT/PLAN: Encounter Diagnoses  Name Primary?  . Annual physical exam Yes  . Screening for deficiency anemia   . Hyperlipidemia   . Screening for thyroid disorder   . Screening for diabetes mellitus   . Gastroesophageal reflux disease without esophagitis    GERD sxs vs less ikely cardiac , will refer if omeprazole doe snot help since midepigastric CP  Annual labs pending Already had flu vaccine at work Refill statin meds, he states he is takign the meds but I last refilled it in 2013 ony for 5 refills and there are no refill request. F/u  in 1 year.   Gross sideeffects, risk and benefits, and alternatives of medications d/w patient. Patient is aware that all medications have potential sideeffects and we are unable to predict every sideeffect or drug-drug interaction that may occur.  Joniyah Mallinger, [place], DO 04/27/2014 10:53 AM

## 2014-05-05 ENCOUNTER — Encounter: Payer: Self-pay | Admitting: Family Medicine

## 2015-02-22 ENCOUNTER — Ambulatory Visit (INDEPENDENT_AMBULATORY_CARE_PROVIDER_SITE_OTHER): Payer: BLUE CROSS/BLUE SHIELD | Admitting: Family Medicine

## 2015-02-22 VITALS — BP 120/74 | HR 72 | Temp 99.1°F | Resp 14 | Ht 67.5 in | Wt 214.0 lb

## 2015-02-22 DIAGNOSIS — R197 Diarrhea, unspecified: Secondary | ICD-10-CM

## 2015-02-22 DIAGNOSIS — R1032 Left lower quadrant pain: Secondary | ICD-10-CM

## 2015-02-22 DIAGNOSIS — R109 Unspecified abdominal pain: Secondary | ICD-10-CM | POA: Diagnosis not present

## 2015-02-22 DIAGNOSIS — R1013 Epigastric pain: Secondary | ICD-10-CM | POA: Diagnosis not present

## 2015-02-22 DIAGNOSIS — G8929 Other chronic pain: Secondary | ICD-10-CM

## 2015-02-22 DIAGNOSIS — A084 Viral intestinal infection, unspecified: Secondary | ICD-10-CM | POA: Diagnosis not present

## 2015-02-22 DIAGNOSIS — K219 Gastro-esophageal reflux disease without esophagitis: Secondary | ICD-10-CM

## 2015-02-22 LAB — POCT URINALYSIS DIPSTICK
Bilirubin, UA: NEGATIVE
Blood, UA: NEGATIVE
Glucose, UA: NEGATIVE
KETONES UA: NEGATIVE
LEUKOCYTES UA: NEGATIVE
Nitrite, UA: NEGATIVE
PH UA: 5.5
PROTEIN UA: NEGATIVE
SPEC GRAV UA: 1.02
UROBILINOGEN UA: 0.2

## 2015-02-22 LAB — POCT CBC
Granulocyte percent: 7.5 %G — AB (ref 37–80)
HEMATOCRIT: 43.5 % (ref 43.5–53.7)
Hemoglobin: 13.6 g/dL — AB (ref 14.1–18.1)
LYMPH, POC: 1 (ref 0.6–3.4)
MCH: 26.3 pg — AB (ref 27–31.2)
MCHC: 31.2 g/dL — AB (ref 31.8–35.4)
MCV: 84.3 fL (ref 80–97)
MID (CBC): 0.1 (ref 0–0.9)
MPV: 7.7 fL (ref 0–99.8)
PLATELET COUNT, POC: 217 10*3/uL (ref 142–424)
POC Granulocyte: 0.6 — AB (ref 2–6.9)
POC LYMPH %: 11.9 % (ref 10–50)
POC MID %: 0.1 %M (ref 0–12)
RBC: 87.5 M/uL — AB (ref 4.69–6.13)
RDW, POC: 13.4 %
WBC: 8.6 10*3/uL (ref 4.6–10.2)

## 2015-02-22 LAB — POCT UA - MICROSCOPIC ONLY
BACTERIA, U MICROSCOPIC: NEGATIVE
CASTS, UR, LPF, POC: NEGATIVE
CRYSTALS, UR, HPF, POC: NEGATIVE
Mucus, UA: NEGATIVE
RBC, urine, microscopic: NEGATIVE
YEAST UA: NEGATIVE

## 2015-02-22 MED ORDER — PANTOPRAZOLE SODIUM 40 MG PO TBEC: 40.0000 mg | DELAYED_RELEASE_TABLET | Freq: Every day | ORAL | Status: DC

## 2015-02-22 MED ORDER — ONDANSETRON 4 MG PO TBDP: 4.0000 mg | ORAL_TABLET | Freq: Three times a day (TID) | ORAL | Status: DC | PRN

## 2015-02-22 NOTE — Patient Instructions (Addendum)
Take over-the-counter Imodium 2 pills initially, then 1 pill every 6-8 hours only if needed for diarrhea. Do not take too many or you will get constipated.  I'm giving you a prescription for ondansetron in case you develop nausea and vomiting. You do not need to get it filled unless necessary.  Take the Protonix 1 daily in the evening for heartburn (pantoprazole).  Stop after taking 2 months and see how you do.,  If symptoms persist you can resume.  If you keep having problems in 3-4 months then we might need to refer for an upper endoscopy.  Stay off work today  In the event of more severe abdominal pain please return or go to the emergency room if necessary

## 2015-02-22 NOTE — Progress Notes (Signed)
Subjective:  Patient ID: [name], male    DOB: [birth year]  Age: 49 y.o. MRN: [id number]  49 year old gentleman who has presented with a problem with left abdominal pain in the left lower quadrant and diarrhea that started yesterday evening. He had diarrhea all night. It is been green and watery. He has a longer history of of epigastric pain and heartburn. He had chills last night but has not had any documented fever. No one else in the family is ill, though he went with a crowd of people last weekend. He does not have a lot of history of chronic diarrheal disease, this being acute. He does have the chronic epigastric pain. He's been cramping especially in the left lower quadrant. His abdomen hurts even now.  He is [employer]), does not speak a lot of English, is comfortably by his father.  Objective:   pleasant gentleman alert and oriented in no major distress. Chest clear. Heart regular without murmurs. Abdomen has diminished but present bowel sounds. Not distended. Soft without organomegaly or masses. Has some tenderness in the hypogastrium. Also very tender in the left lower quadrant. Rectal exam not done.  Assessment & Plan:   Assessment:  Diarrhea Cramping left lower quadrant abdominal pain History of GERD and upper abdominal pain, more chronic Chills  Plan:  We'll check a CBC and urinalysis and proceed from there  Results for orders placed or performed in visit on 02/22/15  POCT CBC  Result Value Ref Range   WBC 8.6 4.6 - 10.2 K/uL   Lymph, poc 1.0 0.6 - 3.4   POC LYMPH PERCENT 11.9 10 - 50 %L   MID (cbc) 0.1 0 - 0.9   POC MID % 0.1 0 - 12 %M   POC Granulocyte 0.6 (A) 2 - 6.9   Granulocyte percent 7.5 (A) 37 - 80 %G   RBC 87.5 (A) 4.69 - 6.13 M/uL   Hemoglobin 13.6 (A) 14.1 - 18.1 g/dL   HCT, POC 43.5 43.5 - 53.7 %   MCV 84.3 80 - 97 fL   MCH, POC 26.3 (A) 27 - 31.2 pg   MCHC 31.2 (A) 31.8 - 35.4 g/dL   RDW, POC 13.4 %   Platelet Count, POC 217 142  - 424 K/uL   MPV 7.7 0 - 99.8 fL  POCT UA - Microscopic Only  Result Value Ref Range   WBC, Ur, HPF, POC 0-2    RBC, urine, microscopic neg    Bacteria, U Microscopic neg    Mucus, UA neg    Epithelial cells, urine per micros 0-2    Crystals, Ur, HPF, POC neg    Casts, Ur, LPF, POC neg    Yeast, UA neg   POCT urinalysis dipstick  Result Value Ref Range   Color, UA yellow    Clarity, UA clear    Glucose, UA neg    Bilirubin, UA neg    Ketones, UA neg    Spec Grav, UA 1.020    Blood, UA neg    pH, UA 5.5    Protein, UA neg    Urobilinogen, UA 0.2    Nitrite, UA neg    Leukocytes, UA Negative Negative   will treat symptomatically for a viral gastroenteritis. If symptoms worsen he needs to return. Will also treat for GERD. Return if any problems arise.   Patient Instructions  Take over-the-counter Imodium 2 pills initially, then 1 pill every 6-8 hours only if needed for diarrhea. Do not  take too many or you will get constipated.  I'm giving you a prescription for ondansetron in case you develop nausea and vomiting. You do not need to get it filled unless necessary.  Take the Protonix 1 daily in the evening for heartburn (pantoprazole).  Stop after taking 2 months and see how you do.,  If symptoms persist you can resume.  If you keep having problems in 3-4 months then we might need to refer for an upper endoscopy.  Stay off work today  In the event of more severe abdominal pain please return or go to the emergency room if necessary       Aizlyn Schifano, MD 02/22/2015

## 2015-05-25 ENCOUNTER — Encounter (HOSPITAL_COMMUNITY): Payer: Self-pay | Admitting: Emergency Medicine

## 2015-05-25 ENCOUNTER — Emergency Department (HOSPITAL_COMMUNITY)
Admission: EM | Admit: 2015-05-25 | Discharge: 2015-05-25 | Disposition: A | Discharge status: Home / Self Care | Payer: BLUE CROSS/BLUE SHIELD | Attending: Emergency Medicine | Admitting: Emergency Medicine

## 2015-05-25 ENCOUNTER — Emergency Department (HOSPITAL_COMMUNITY): Payer: BLUE CROSS/BLUE SHIELD

## 2015-05-25 DIAGNOSIS — Z8639 Personal history of other endocrine, nutritional and metabolic disease: Secondary | ICD-10-CM | POA: Diagnosis not present

## 2015-05-25 DIAGNOSIS — R110 Nausea: Secondary | ICD-10-CM | POA: Insufficient documentation

## 2015-05-25 DIAGNOSIS — Z8669 Personal history of other diseases of the nervous system and sense organs: Secondary | ICD-10-CM | POA: Insufficient documentation

## 2015-05-25 DIAGNOSIS — R42 Dizziness and giddiness: Secondary | ICD-10-CM | POA: Diagnosis not present

## 2015-05-25 DIAGNOSIS — Z87891 Personal history of nicotine dependence: Secondary | ICD-10-CM | POA: Diagnosis not present

## 2015-05-25 DIAGNOSIS — Z8719 Personal history of other diseases of the digestive system: Secondary | ICD-10-CM | POA: Insufficient documentation

## 2015-05-25 DIAGNOSIS — R519 Headache, unspecified: Secondary | ICD-10-CM

## 2015-05-25 DIAGNOSIS — R51 Headache: Secondary | ICD-10-CM | POA: Diagnosis not present

## 2015-05-25 DIAGNOSIS — M542 Cervicalgia: Secondary | ICD-10-CM | POA: Diagnosis present

## 2015-05-25 HISTORY — DX: Gastro-esophageal reflux disease without esophagitis: K21.9

## 2015-05-25 HISTORY — DX: Sleep apnea, unspecified: G47.30

## 2015-05-25 HISTORY — DX: Pure hypercholesterolemia, unspecified: E78.00

## 2015-05-25 LAB — I-STAT CHEM 8, ED
BUN: 13 mg/dL (ref 6–20)
CHLORIDE: 101 mmol/L (ref 101–111)
Calcium, Ion: 1.14 mmol/L (ref 1.12–1.23)
Creatinine, Ser: 1 mg/dL (ref 0.61–1.24)
Glucose, Bld: 126 mg/dL — ABNORMAL HIGH (ref 65–99)
HEMATOCRIT: 46 % (ref 39.0–52.0)
Hemoglobin: 15.6 g/dL (ref 13.0–17.0)
POTASSIUM: 3.8 mmol/L (ref 3.5–5.1)
SODIUM: 139 mmol/L (ref 135–145)
TCO2: 21 mmol/L (ref 0–100)

## 2015-05-25 MED ORDER — IOHEXOL 350 MG/ML SOLN
100.0000 mL | Freq: Once | INTRAVENOUS | Status: AC | PRN
  Administered 2015-05-25: 100 mL via INTRAVENOUS

## 2015-05-25 MED ORDER — DIPHENHYDRAMINE HCL 50 MG/ML IJ SOLN
25.0000 mg | Freq: Once | INTRAMUSCULAR | Status: AC
  Administered 2015-05-25: 25 mg via INTRAVENOUS
  Filled 2015-05-25: qty 1

## 2015-05-25 MED ORDER — SODIUM CHLORIDE 0.9 % IV BOLUS (SEPSIS)
1000.0000 mL | Freq: Once | INTRAVENOUS | Status: AC
  Administered 2015-05-25: 1000 mL via INTRAVENOUS

## 2015-05-25 MED ORDER — CYCLOBENZAPRINE HCL 10 MG PO TABS: 10.0000 mg | ORAL_TABLET | Freq: Two times a day (BID) | ORAL | Status: DC | PRN

## 2015-05-25 MED ORDER — METOCLOPRAMIDE HCL 5 MG/ML IJ SOLN
10.0000 mg | Freq: Once | INTRAMUSCULAR | Status: AC
  Administered 2015-05-25: 10 mg via INTRAVENOUS
  Filled 2015-05-25: qty 2

## 2015-05-25 MED ORDER — IBUPROFEN 600 MG PO TABS: 600.0000 mg | ORAL_TABLET | Freq: Three times a day (TID) | ORAL | Status: DC | PRN

## 2015-05-25 NOTE — ED Provider Notes (Signed)
CSN: [id number]     Arrival date & time 05/25/15  1918 History   First MD Initiated Contact with Patient 05/25/15 2005     Chief Complaint  Patient presents with  . Dizziness     (Consider location/radiation/quality/duration/timing/severity/associated sxs/prior Treatment) HPI  49 year old male presents with acute onset of neck pain that radiates up to his scalp. Started at around 3 PM and has progressively worsened. No sudden onset of severe pain. Patient states the pain is mostly just left lateral to midline. No fevers or chills. No neck stiffness. Denies weakness or numbness. Has felt nauseated and dizziness intermittently. Denies blurry vision, chest pain, or shortness of breath. Has had headaches like this in the past but nothing has been as severe as this. Rates his pain as a 9/10, is sharp and stabbing. No injuries.   Past Medical History  Diagnosis Date  . High cholesterol   . Sleep apnea   . GERD (gastroesophageal reflux disease)    History reviewed. No pertinent past surgical history. History reviewed. No pertinent family history. Social History  Substance Use Topics  . Smoking status: Former Research scientist (life sciences)  . Smokeless tobacco: Never Used  . Alcohol Use: Yes    Review of Systems  Constitutional: Negative for fever.  Eyes: Negative for visual disturbance.  Gastrointestinal: Positive for nausea. Negative for vomiting.  Musculoskeletal: Positive for neck pain. Negative for back pain and neck stiffness.  Neurological: Positive for dizziness and headaches. Negative for weakness and numbness.  All other systems reviewed and are negative.     Allergies  Review of patient's allergies indicates not on file.  Home Medications   Prior to Admission medications   Not on File   BP 136/72 mmHg  Pulse 66  Temp(Src) 97.6 F (36.4 C) (Oral)  Resp 18  SpO2 96% Physical Exam  Constitutional: He is oriented to person, place, and time. He appears well-developed and well-nourished.    HENT:  Head: Normocephalic and atraumatic.  Right Ear: External ear normal.  Left Ear: External ear normal.  Nose: Nose normal.  Eyes: EOM are normal. Pupils are equal, round, and reactive to light. Right eye exhibits no discharge. Left eye exhibits no discharge.  Neck: Normal range of motion. Neck supple. Muscular tenderness present. No spinous process tenderness present. Normal range of motion present.    No meningismus  Cardiovascular: Normal rate, regular rhythm, normal heart sounds and intact distal pulses.   Pulmonary/Chest: Effort normal and breath sounds normal.  Abdominal: Soft. There is no tenderness.  Musculoskeletal: He exhibits no edema.  Neurological: He is alert and oriented to person, place, and time.  CN 2-12 grossly intact. 5/5 strength in all 4 extremities. Normal finger to nose. Normal gross sensation  Skin: Skin is warm and dry.  Nursing note and vitals reviewed.   ED Course  Procedures (including critical care time) Labs Review Labs Reviewed  I-STAT CHEM 8, ED - Abnormal; Notable for the following:    Glucose, Bld 126 (*)    All other components within normal limits    Imaging Review Ct Angio Head W/cm &/or Wo Cm  05/25/2015  CLINICAL DATA:  Initial evaluation for acute headache and dizziness. EXAM: CT ANGIOGRAPHY HEAD AND NECK TECHNIQUE: Multidetector CT imaging of the head and neck was performed using the standard protocol during bolus administration of intravenous contrast. Multiplanar CT image reconstructions and MIPs were obtained to evaluate the vascular anatomy. Carotid stenosis measurements (when applicable) are obtained utilizing NASCET criteria, using the distal  internal carotid diameter as the denominator. CONTRAST:  171mL OMNIPAQUE IOHEXOL 350 MG/ML SOLN COMPARISON:  None. FINDINGS: CT HEAD There is no acute intracranial hemorrhage or infarct. No mass lesion or midline shift. Gray-white matter differentiation is well maintained. Ventricles are  normal in size without evidence of hydrocephalus. CSF containing spaces are within normal limits. No extra-axial fluid collection. The calvarium is intact. Orbital soft tissues are within normal limits. Mild mucosal began 0 within the ethmoidal air cells. Paranasal sinuses are otherwise clear. Scalp soft tissues are unremarkable. CTA NECK Aortic arch: Visualized aortic arch of normal caliber with normal 3 vessel morphology. No high-grade stenosis seen at the origin of the great vessels. Visualized subclavian arteries are widely patent. Right carotid system: Right common carotid artery widely patent from its origin to the carotid bifurcation. Right ICA widely patent from the bifurcation to the skullbase. No evidence for stenosis, dissection, or occlusion within the right carotid artery system. Left carotid system: Left common carotid artery widely patent from its origin to the carotid bifurcation. Left ICA widely patent from the bifurcation to the skullbase. No stenosis, dissection, or occlusion within the left carotid artery system. Vertebral arteries:Both vertebral arteries arise from the subclavian arteries. The right vertebral artery is dominant. Left vertebral artery is diffusely diminutive and hypoplastic. Vertebral arteries are patent along their entire course without evidence for dissection, occlusion, or stenosis. Skeleton: No acute osseous abnormality. Straightening of the normal cervical lordosis with bulky anterior osteophytic spurring from C4-5 through C6-7. No worrisome lytic or blastic osseous lesions. Other neck: Visualized lungs demonstrate no acute abnormality. Mild paraseptal emphysema at the lung apices. Visualized superior mediastinum within normal limits. Thyroid gland normal. No adenopathy within the neck. No acute soft tissue abnormality. CTA HEAD Anterior circulation: The petrous, cavernous, and supraclinoid segments of the internal carotid arteries are widely patent bilaterally. ICA termini  patent. A1 segments, anterior communicating artery, and anterior cerebral arteries within normal limits. M1 segments widely patent without stenosis or occlusion. MCA bifurcations normal. Distal MCA branches symmetric and well opacified bilaterally. Posterior circulation: Vertebral arteries patent to the vertebrobasilar junction. Right vertebral artery is dominant. Posterior inferior cerebral arteries patent. Basilar artery somewhat diminutive but patent to its distal aspect. Superior cerebellar arteries patent bilaterally. P1 segments are somewhat small in hypoplastic bilaterally. There are prominent posterior communicating arteries bilaterally. PCAs themselves are well opacified to their distal aspects. Venous sinuses: Venous sinuses are patent without evidence for thrombosis or other acute abnormality. Anatomic variants: No anatomic variant.  No aneurysm. Delayed phase: No abnormal enhancement on delayed sequence. IMPRESSION: 1. No acute intracranial process. 2. Normal CTA of the head and neck. 3. Bulky anterior osteophytic spurring from C3-4 through C6-7. Electronically Signed   By: Jeannine Boga M.D.   On: 05/25/2015 23:06   Ct Angio Neck W/cm &/or Wo/cm  05/25/2015  CLINICAL DATA:  Initial evaluation for acute headache and dizziness. EXAM: CT ANGIOGRAPHY HEAD AND NECK TECHNIQUE: Multidetector CT imaging of the head and neck was performed using the standard protocol during bolus administration of intravenous contrast. Multiplanar CT image reconstructions and MIPs were obtained to evaluate the vascular anatomy. Carotid stenosis measurements (when applicable) are obtained utilizing NASCET criteria, using the distal internal carotid diameter as the denominator. CONTRAST:  147mL OMNIPAQUE IOHEXOL 350 MG/ML SOLN COMPARISON:  None. FINDINGS: CT HEAD There is no acute intracranial hemorrhage or infarct. No mass lesion or midline shift. Gray-white matter differentiation is well maintained. Ventricles are  normal in size without evidence of  hydrocephalus. CSF containing spaces are within normal limits. No extra-axial fluid collection. The calvarium is intact. Orbital soft tissues are within normal limits. Mild mucosal began 0 within the ethmoidal air cells. Paranasal sinuses are otherwise clear. Scalp soft tissues are unremarkable. CTA NECK Aortic arch: Visualized aortic arch of normal caliber with normal 3 vessel morphology. No high-grade stenosis seen at the origin of the great vessels. Visualized subclavian arteries are widely patent. Right carotid system: Right common carotid artery widely patent from its origin to the carotid bifurcation. Right ICA widely patent from the bifurcation to the skullbase. No evidence for stenosis, dissection, or occlusion within the right carotid artery system. Left carotid system: Left common carotid artery widely patent from its origin to the carotid bifurcation. Left ICA widely patent from the bifurcation to the skullbase. No stenosis, dissection, or occlusion within the left carotid artery system. Vertebral arteries:Both vertebral arteries arise from the subclavian arteries. The right vertebral artery is dominant. Left vertebral artery is diffusely diminutive and hypoplastic. Vertebral arteries are patent along their entire course without evidence for dissection, occlusion, or stenosis. Skeleton: No acute osseous abnormality. Straightening of the normal cervical lordosis with bulky anterior osteophytic spurring from C4-5 through C6-7. No worrisome lytic or blastic osseous lesions. Other neck: Visualized lungs demonstrate no acute abnormality. Mild paraseptal emphysema at the lung apices. Visualized superior mediastinum within normal limits. Thyroid gland normal. No adenopathy within the neck. No acute soft tissue abnormality. CTA HEAD Anterior circulation: The petrous, cavernous, and supraclinoid segments of the internal carotid arteries are widely patent bilaterally. ICA termini  patent. A1 segments, anterior communicating artery, and anterior cerebral arteries within normal limits. M1 segments widely patent without stenosis or occlusion. MCA bifurcations normal. Distal MCA branches symmetric and well opacified bilaterally. Posterior circulation: Vertebral arteries patent to the vertebrobasilar junction. Right vertebral artery is dominant. Posterior inferior cerebral arteries patent. Basilar artery somewhat diminutive but patent to its distal aspect. Superior cerebellar arteries patent bilaterally. P1 segments are somewhat small in hypoplastic bilaterally. There are prominent posterior communicating arteries bilaterally. PCAs themselves are well opacified to their distal aspects. Venous sinuses: Venous sinuses are patent without evidence for thrombosis or other acute abnormality. Anatomic variants: No anatomic variant.  No aneurysm. Delayed phase: No abnormal enhancement on delayed sequence. IMPRESSION: 1. No acute intracranial process. 2. Normal CTA of the head and neck. 3. Bulky anterior osteophytic spurring from C3-4 through C6-7. Electronically Signed   By: Jeannine Boga M.D.   On: 05/25/2015 23:06   I have personally reviewed and evaluated these images and lab results as part of my medical decision-making.   EKG Interpretation None      ED ECG REPORT   Date: 05/25/2015  Rate: 65  Rhythm: normal sinus rhythm  QRS Axis: normal  Intervals: PR prolonged  ST/T Wave abnormalities: normal  Conduction Disutrbances:none  Narrative Interpretation: Sinus rhythm with 1 PVC  Old EKG Reviewed: none available  I have personally reviewed the EKG tracing and agree with the computerized printout as noted.  MDM   Final diagnoses:  Neck pain  Posterior neck pain  Bad headache    I believe patient's pain is coming from musculoskeletal neck pain that radiates to head. Low suspicion for [facility] with gradual onset. CTA obtained to r/o dissection or vascular issue given neck  pain and dizziness. Pain significantly improved after reglan/benadryl. Plan to treat with ibuprofen/muscle relaxers and have patient f/u with PCP. Discussed return precautions.    Sherwood Gambler, MD 05/25/15 [phone]

## 2015-05-25 NOTE — ED Notes (Signed)
Pt presents to ED from home with c/o headache and dizziness, onset today at 1500.  Pt reports he has near-syncopal episode; also reports generalized weakness and nausea.

## 2015-06-27 ENCOUNTER — Ambulatory Visit (INDEPENDENT_AMBULATORY_CARE_PROVIDER_SITE_OTHER): Payer: BLUE CROSS/BLUE SHIELD | Admitting: Family Medicine

## 2015-06-27 VITALS — BP 112/70 | HR 59 | Temp 97.9°F | Resp 18 | Ht 67.5 in | Wt 212.0 lb

## 2015-06-27 DIAGNOSIS — E669 Obesity, unspecified: Secondary | ICD-10-CM

## 2015-06-27 DIAGNOSIS — M1712 Unilateral primary osteoarthritis, left knee: Secondary | ICD-10-CM

## 2015-06-27 DIAGNOSIS — E785 Hyperlipidemia, unspecified: Secondary | ICD-10-CM | POA: Diagnosis not present

## 2015-06-27 DIAGNOSIS — K219 Gastro-esophageal reflux disease without esophagitis: Secondary | ICD-10-CM

## 2015-06-27 DIAGNOSIS — G4733 Obstructive sleep apnea (adult) (pediatric): Secondary | ICD-10-CM | POA: Diagnosis not present

## 2015-06-27 MED ORDER — PANTOPRAZOLE SODIUM 40 MG PO TBEC: 40.0000 mg | DELAYED_RELEASE_TABLET | Freq: Every day | ORAL | Status: DC

## 2015-06-27 MED ORDER — SIMVASTATIN 20 MG PO TABS: 20.0000 mg | ORAL_TABLET | Freq: Every evening | ORAL | Status: DC

## 2015-06-27 NOTE — Progress Notes (Signed)
Patient ID: [name], male    DOB: [birth year]  Age: 49 y.o. MRN: [id number]  Chief Complaint  Patient presents with  . Hospitalization Follow-up  . Headache    Subjective:   Patient is here for a checkup. He was in the emergency room not long ago and had CT scans and labs done. He also has some labs that he got done at his workplace. He had not seen me for while. He is now married with a couple of children 28 and 74 years old. He has had some problems with getting back into his alcoholism. Some years ago I talked to him about this and he quit for many years, but nares had a couple of instances of having to go to the emergency room due to alcohol-related issues. He has a lot of headaches in his neck. He has problems with pain in his left knee. He is put on weight. He works driving a Forensic scientist. No dizziness. No problems with his eyes, ears, nose, or throat. No breathing difficulty. He gets occasional very brief fleeting chest pains which last for a few seconds. He is not getting regular exercise. His GERD is doing fairly well. No other GI or GU complaints. The left knee hurts as noted above. He has not had swelling up recently. He has a knee brace but doesn't wear it. He does have obstructive sleep apnea and uses his CPAP.  Current allergies, medications, problem list, past/family and social histories reviewed.  Objective:  BP 112/70 mmHg  Pulse 59  Temp(Src) 97.9 F (36.6 C) (Oral)  Resp 18  Ht 5' 7.5" (1.715 m)  Wt 212 lb (96.163 kg)  BMI 32.69 kg/m2  SpO2 98%  No major distress. 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 His TMs are normal. Throat clear. Neck supple without significant nodes. Chest is clear to auscultation. Heart regular without murmurs. No swelling of his left knee. No ankle edema. Reviewed his labs from the workplace and emergency room.  Assessment & Plan:   Assessment: 1. Obesity   2. Primary osteoarthritis of left knee   3. Hyperlipidemia   4. Gastroesophageal reflux  disease, esophagitis presence not specified   OSA     Plan: A long talk about his alcohol issues, urged him to not take a single drink because it seems to lead into problems for him.  Continue to stay involved in his church. He is illiterate, suggested he consider listening to [employer].  Did not check labs this time. Continue same medications and return in 6-12 months.    Meds ordered this encounter  Medications  . simvastatin (ZOCOR) 20 MG tablet    Sig: Take 1 tablet (20 mg total) by mouth every evening.    Dispense:  30 tablet    Refill:  11  . pantoprazole (PROTONIX) 40 MG tablet    Sig: Take 1 tablet (40 mg total) by mouth daily. For heartburn and reflux    Dispense:  30 tablet    Refill:  11         Patient Instructions  It is most important for you to avoid alcohol. When you drink a little bit, you are someone who is at high risk of drinking a lot. It is best to just realizes that alcohol is a problem for you and never drink any  Eat less, especially cutting back on Rice. Try to get regular exercise. Make it your goal to lose weight, to get less than 200 pounds by  3 months from now and to try to get down to 190 by this summer  If you will lose weight your knee will do better.  Continue taking the pantoprazole 1 daily for the stomach  Continue taking the simvastatin 1 daily to keep your cholesterol down  The medicine the emergency room gave you, ibuprofen and cyclobenzaprine, you can save to use only when needed when you have a lot of pain in your head or neck or back or knee.  Return in about 6-12 months for a recheck. Sooner if you have problems.  Spend a lot of time with your family to keep that as a strong part of your life. It is most important.     Return in about 6 months (around 12/26/2015).   HOPPER,DAVID, MD 06/27/2015

## 2015-06-27 NOTE — Patient Instructions (Signed)
It is most important for you to avoid alcohol. When you drink a little bit, you are someone who is at high risk of drinking a lot. It is best to just realizes that alcohol is a problem for you and never drink any  Eat less, especially cutting back on Rice. Try to get regular exercise. Make it your goal to lose weight, to get less than 200 pounds by 3 months from now and to try to get down to 190 by this summer  If you will lose weight your knee will do better.  Continue taking the pantoprazole 1 daily for the stomach  Continue taking the simvastatin 1 daily to keep your cholesterol down  The medicine the emergency room gave you, ibuprofen and cyclobenzaprine, you can save to use only when needed when you have a lot of pain in your head or neck or back or knee.  Return in about 6-12 months for a recheck. Sooner if you have problems.  Spend a lot of time with your family to keep that as a strong part of your life. It is most important.

## 2016-02-19 DIAGNOSIS — H40033 Anatomical narrow angle, bilateral: Secondary | ICD-10-CM | POA: Diagnosis not present

## 2016-02-19 DIAGNOSIS — H04123 Dry eye syndrome of bilateral lacrimal glands: Secondary | ICD-10-CM | POA: Diagnosis not present

## 2016-08-04 ENCOUNTER — Other Ambulatory Visit: Payer: Self-pay | Admitting: Family Medicine

## 2016-08-04 DIAGNOSIS — E785 Hyperlipidemia, unspecified: Secondary | ICD-10-CM

## 2016-08-09 IMAGING — CT CT ANGIO HEAD
2 of 9 series · 7 of 33 positions shown · IV contrast (OMNIPAQUE 300)
Comparison: None.

CLINICAL DATA: Initial evaluation for acute headache and dizziness.

EXAM:
CT ANGIOGRAPHY HEAD AND NECK
TECHNIQUE: Multidetector CT imaging of the head and neck was performed using
the standard protocol during bolus administration of intravenous
contrast. Multiplanar CT image reconstructions and MIPs were
obtained to evaluate the vascular anatomy. Carotid stenosis
measurements (when applicable) are obtained utilizing NASCET
criteria, using the distal internal carotid diameter as the
denominator.
CONTRAST:  100mL OMNIPAQUE IOHEXOL 350 MG/ML SOLN

[Series 6: head/neck · axial · 0.52mm/px · z∈[+1180,+1292]mm · 2 of 170 slices shown]
[im 57/170  soft-tissue]
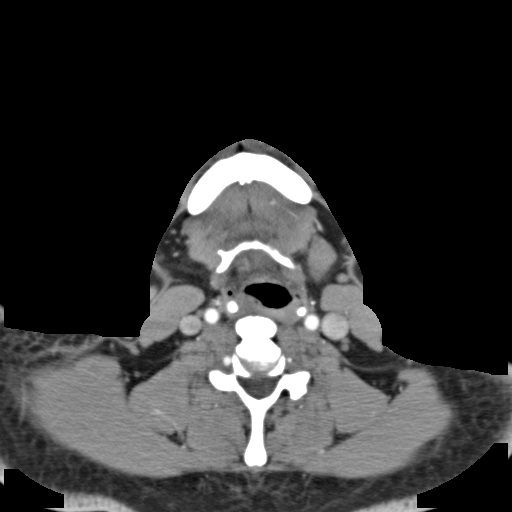
[im 113/170  soft-tissue]
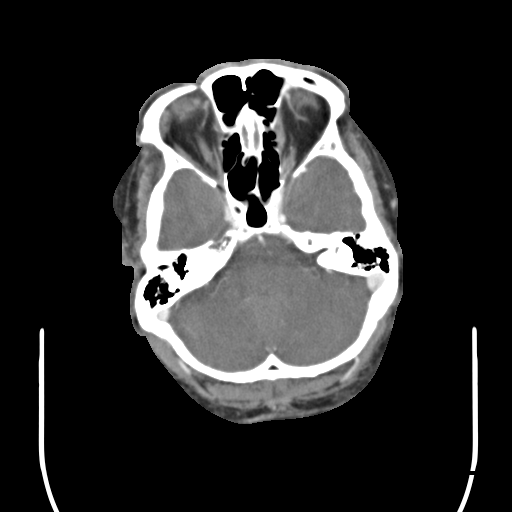

[Series 9: axial thin · axial · 0.39mm/px · z∈[+1125,+1349]mm · 5 of 338 slices shown]
[im 57/338  soft-tissue]
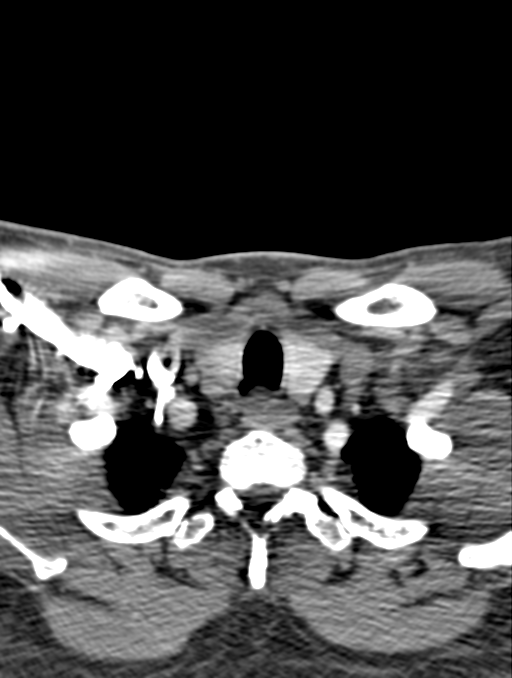
[im 113/338  bone]
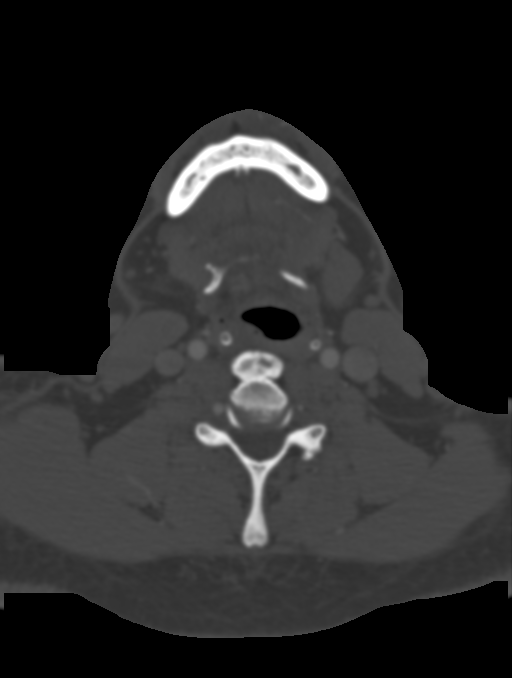
[im 169/338  soft-tissue]
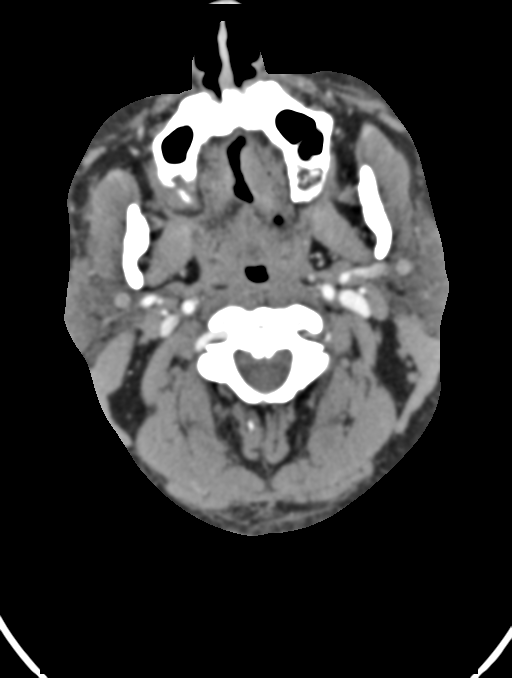
[im 225/338  bone]
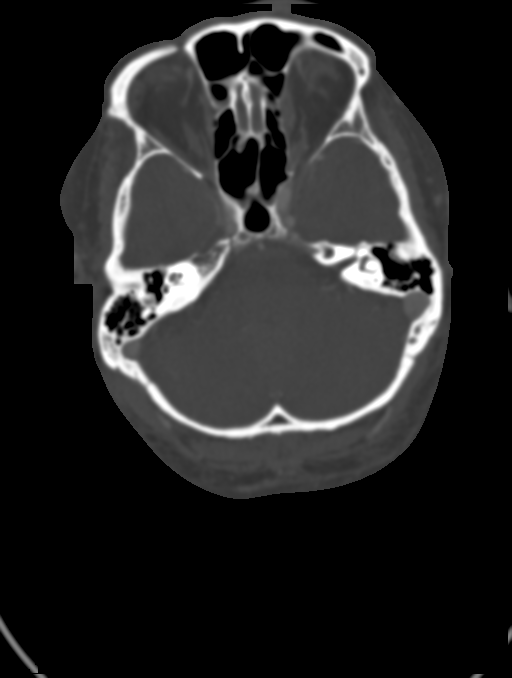
[im 281/338  soft-tissue]
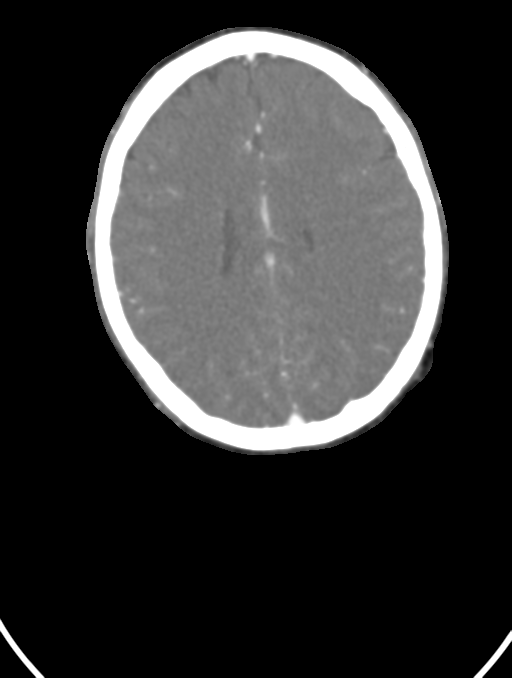

[7 of 33 positions shown; findings below may reference images not displayed]

FINDINGS: CT HEAD

There is no acute intracranial hemorrhage or infarct. No mass lesion
or midline shift. Gray-white matter differentiation is well
maintained. Ventricles are normal in size without evidence of
hydrocephalus. CSF containing spaces are within normal limits. No
extra-axial fluid collection.

The calvarium is intact.

Orbital soft tissues are within normal limits.

Mild mucosal began 0 within the ethmoidal air cells. Paranasal
sinuses are otherwise clear.

Scalp soft tissues are unremarkable.

CTA NECK

Aortic arch: Visualized aortic arch of normal caliber with normal 3
vessel morphology. No high-grade stenosis seen at the origin of the
great vessels. Visualized subclavian arteries are widely patent.

Right carotid system: Right common carotid artery widely patent from
its origin to the carotid bifurcation. Right ICA widely patent from
the bifurcation to the skullbase. No evidence for stenosis,
dissection, or occlusion within the right carotid artery system.

Left carotid system: Left common carotid artery widely patent from
its origin to the carotid bifurcation. Left ICA widely patent from
the bifurcation to the skullbase. No stenosis, dissection, or
occlusion within the left carotid artery system.

Vertebral arteries:Both vertebral arteries arise from the subclavian
arteries. The right vertebral artery is dominant. Left vertebral
artery is diffusely diminutive and hypoplastic. Vertebral arteries
are patent along their entire course without evidence for
dissection, occlusion, or stenosis.

Skeleton: No acute osseous abnormality. Straightening of the normal
cervical lordosis with bulky anterior osteophytic spurring from C4-5
through C6-7. No worrisome lytic or blastic osseous lesions.

Other neck: Visualized lungs demonstrate no acute abnormality. Mild
paraseptal emphysema at the lung apices. Visualized superior
mediastinum within normal limits. Thyroid gland normal. No
adenopathy within the neck. No acute soft tissue abnormality.

CTA HEAD

Anterior circulation: The petrous, cavernous, and supraclinoid
segments of the internal carotid arteries are widely patent
bilaterally. ICA termini patent. A1 segments, anterior communicating
artery, and anterior cerebral arteries within normal limits. M1
segments widely patent without stenosis or occlusion. MCA
bifurcations normal. Distal MCA branches symmetric and well
opacified bilaterally.

Posterior circulation: Vertebral arteries patent to the
vertebrobasilar junction. Right vertebral artery is dominant.
Posterior inferior cerebral arteries patent. Basilar artery somewhat
diminutive but patent to its distal aspect. Superior cerebellar
arteries patent bilaterally. P1 segments are somewhat small in
hypoplastic bilaterally. There are prominent posterior communicating
arteries bilaterally. PCAs themselves are well opacified to their
distal aspects.

Venous sinuses: Venous sinuses are patent without evidence for
thrombosis or other acute abnormality.

Anatomic variants: No anatomic variant.  No aneurysm.

Delayed phase: No abnormal enhancement on delayed sequence.
IMPRESSION: 1. No acute intracranial process.
2. Normal CTA of the head and neck.
3. Bulky anterior osteophytic spurring from C3-4 through C6-7.

## 2017-01-12 ENCOUNTER — Other Ambulatory Visit: Payer: Self-pay | Admitting: Physician Assistant

## 2017-01-12 DIAGNOSIS — E785 Hyperlipidemia, unspecified: Secondary | ICD-10-CM

## 2017-02-17 ENCOUNTER — Encounter: Payer: Self-pay | Admitting: Physician Assistant

## 2017-02-17 ENCOUNTER — Ambulatory Visit (INDEPENDENT_AMBULATORY_CARE_PROVIDER_SITE_OTHER): Payer: BLUE CROSS/BLUE SHIELD | Admitting: Physician Assistant

## 2017-02-17 VITALS — BP 120/72 | HR 61 | Temp 97.7°F | Resp 18 | Ht 67.0 in | Wt 213.8 lb

## 2017-02-17 DIAGNOSIS — E785 Hyperlipidemia, unspecified: Secondary | ICD-10-CM | POA: Diagnosis not present

## 2017-02-17 DIAGNOSIS — H40033 Anatomical narrow angle, bilateral: Secondary | ICD-10-CM | POA: Diagnosis not present

## 2017-02-17 DIAGNOSIS — H04123 Dry eye syndrome of bilateral lacrimal glands: Secondary | ICD-10-CM | POA: Diagnosis not present

## 2017-02-17 NOTE — Patient Instructions (Signed)
     IF you received an x-ray today, you will receive an invoice from [facility]. Please contact [facility] at [phone] with questions or concerns regarding your invoice.   IF you received labwork today, you will receive an invoice from [facility]. Please contact [facility] at [phone] with questions or concerns regarding your invoice.   Our billing staff will not be able to assist you with questions regarding bills from these companies.  You will be contacted with the lab results as soon as they are available. The fastest way to get your results is to activate your My Chart account. Instructions are located on the last page of this paperwork. If you have not heard from us regarding the results in 2 weeks, please contact this office.     

## 2017-02-17 NOTE — Progress Notes (Signed)
 [name]  MRN: [id number] DOB: 05/12/1966  Subjective:  [name] is a 51 y.o. male seen in office today for a chief complaint of medication refill for simvastatin. Pt has had dx of HLD for a number of years. He is controlled on simvastatin 20mg daily. Has not followed up in office for lab work in in > 1 year. He has just been receiving courtesy refills from our office. Last lipid panel in Epic was 2015. Pt is not fasting today. Diet consists of mostly "Asian food" such as chicken, rice, and veggies. He drinks mostly water. Will drink beer occasionally. Denies any muscle aches, nausea, and vomiting. Has no other questions or concerns.   Review of Systems  Constitutional: Negative for chills, diaphoresis and fever.  Gastrointestinal: Negative for abdominal pain and diarrhea.  Neurological: Negative for dizziness, weakness and light-headedness.    Patient Active Problem List   Diagnosis Date Noted  . OSA (obstructive sleep apnea) 06/27/2015  . Carpal tunnel syndrome, bilateral 04/29/2012    Current Outpatient Prescriptions on File Prior to Visit  Medication Sig Dispense Refill  . pantoprazole (PROTONIX) 40 MG tablet Take 1 tablet (40 mg total) by mouth daily. For heartburn and reflux 30 tablet 11  . simvastatin (ZOCOR) 20 MG tablet TAKE 1 TABLET BY MOUTH EVERY DAY EVERY EVENING 30 tablet 0  . acetaminophen (TYLENOL) 500 MG tablet Take 1,000 mg by mouth every 6 (six) hours as needed for mild pain, moderate pain or headache. Reported on 06/27/2015    . cyclobenzaprine (FLEXERIL) 10 MG tablet Take 1 tablet (10 mg total) by mouth 2 (two) times daily as needed for muscle spasms. (Patient not taking: Reported on 02/17/2017) 20 tablet 0  . ibuprofen (ADVIL,MOTRIN) 600 MG tablet Take 1 tablet (600 mg total) by mouth every 8 (eight) hours as needed for headache. (Patient not taking: Reported on 02/17/2017) 30 tablet 0  . ondansetron (ZOFRAN ODT) 4 MG disintegrating tablet Take 1 tablet (4 mg  total) by mouth every 8 (eight) hours as needed for nausea or vomiting. (Patient not taking: Reported on 06/27/2015) 10 tablet 0   No current facility-administered medications on file prior to visit.     No Known Allergies   Objective:  BP 120/72 (BP Location: Right Arm, Patient Position: Sitting, Cuff Size: Large)   Pulse 61   Temp 97.7 F (36.5 C) (Oral)   Resp 18   Ht 5' 7" (1.702 m)   Wt 213 lb 12.8 oz (97 kg)   SpO2 97%   BMI 33.49 kg/m   Physical Exam  Constitutional: He is oriented to person, place, and time and well-developed, well-nourished, and in no distress.  HENT:  Head: Normocephalic and atraumatic.  Eyes: Conjunctivae are normal.  Neck: Normal range of motion.  Pulmonary/Chest: Effort normal.  Abdominal: Soft. Bowel sounds are normal. There is no tenderness.  Neurological: He is alert and oriented to person, place, and time. Gait normal.  Skin: Skin is warm and dry.  Psychiatric: Affect normal.  Vitals reviewed.   Assessment and Plan :   1. Hyperlipidemia, unspecified hyperlipidemia type Pt is not fasting today and therefore will need to return for lab only visit when fasting for lipid panel and CMP. Orders have been placed. Will refill medication at this time. Plan to follow up in one year.  - CMP14+EGFR; Future - Lipid panel; Future - simvastatin (ZOCOR) 20 MG tablet; TAKE 1 TABLET BY MOUTH EVERY DAY EVERY EVENING  Dispense: 90 tablet; Refill:   3   , PA-C  [facility] at [facility] 02/18/2017 11:52 AM   

## 2017-02-18 MED ORDER — SIMVASTATIN 20 MG PO TABS: ORAL_TABLET | ORAL | 3 refills | Status: DC

## 2017-02-24 ENCOUNTER — Ambulatory Visit (INDEPENDENT_AMBULATORY_CARE_PROVIDER_SITE_OTHER): Payer: BLUE CROSS/BLUE SHIELD | Admitting: Emergency Medicine

## 2017-02-24 DIAGNOSIS — E785 Hyperlipidemia, unspecified: Secondary | ICD-10-CM | POA: Diagnosis not present

## 2017-02-25 ENCOUNTER — Encounter: Payer: Self-pay | Admitting: Radiology

## 2017-02-25 LAB — CMP14+EGFR
ALBUMIN: 4.6 g/dL (ref 3.5–5.5)
ALK PHOS: 59 IU/L (ref 39–117)
ALT: 29 IU/L (ref 0–44)
AST: 23 IU/L (ref 0–40)
Albumin/Globulin Ratio: 1.4 (ref 1.2–2.2)
BILIRUBIN TOTAL: 0.6 mg/dL (ref 0.0–1.2)
BUN / CREAT RATIO: 11 (ref 9–20)
BUN: 11 mg/dL (ref 6–24)
CO2: 26 mmol/L (ref 20–29)
Calcium: 9.5 mg/dL (ref 8.7–10.2)
Chloride: 105 mmol/L (ref 96–106)
Creatinine, Ser: 1.01 mg/dL (ref 0.76–1.27)
GFR calc Af Amer: 100 mL/min/{1.73_m2} (ref >59)
GFR calc non Af Amer: 87 mL/min/{1.73_m2} (ref >59)
GLUCOSE: 99 mg/dL (ref 65–99)
Globulin, Total: 3.2 g/dL (ref 1.5–4.5)
Potassium: 4.7 mmol/L (ref 3.5–5.2)
Sodium: 142 mmol/L (ref 134–144)
Total Protein: 7.8 g/dL (ref 6.0–8.5)

## 2017-02-25 LAB — LIPID PANEL
CHOL/HDL RATIO: 3.6 ratio (ref 0.0–5.0)
CHOLESTEROL TOTAL: 155 mg/dL (ref 100–199)
HDL: 43 mg/dL (ref >39)
LDL CALC: 79 mg/dL (ref 0–99)
Triglycerides: 167 mg/dL — ABNORMAL HIGH (ref 0–149)
VLDL CHOLESTEROL CAL: 33 mg/dL (ref 5–40)

## 2017-02-25 NOTE — Progress Notes (Signed)
Here for blood work only.

## 2017-04-01 ENCOUNTER — Encounter: Payer: Self-pay | Admitting: Physician Assistant

## 2017-04-01 ENCOUNTER — Ambulatory Visit (INDEPENDENT_AMBULATORY_CARE_PROVIDER_SITE_OTHER): Payer: BLUE CROSS/BLUE SHIELD | Admitting: Physician Assistant

## 2017-04-01 ENCOUNTER — Ambulatory Visit (INDEPENDENT_AMBULATORY_CARE_PROVIDER_SITE_OTHER): Payer: BLUE CROSS/BLUE SHIELD

## 2017-04-01 VITALS — BP 118/76 | HR 54 | Temp 98.0°F | Resp 16 | Ht 67.0 in | Wt 213.0 lb

## 2017-04-01 DIAGNOSIS — R079 Chest pain, unspecified: Secondary | ICD-10-CM

## 2017-04-01 LAB — POCT CBC
GRANULOCYTE PERCENT: 58.3 % (ref 37–80)
HCT, POC: 43.4 % — AB (ref 43.5–53.7)
Hemoglobin: 14.2 g/dL (ref 14.1–18.1)
Lymph, poc: 1.9 (ref 0.6–3.4)
MCH, POC: 28.3 pg (ref 27–31.2)
MCHC: 32.8 g/dL (ref 31.8–35.4)
MCV: 86.4 fL (ref 80–97)
MID (CBC): 0.3 (ref 0–0.9)
MPV: 8.1 fL (ref 0–99.8)
PLATELET COUNT, POC: 225 10*3/uL (ref 142–424)
POC Granulocyte: 3.1 (ref 2–6.9)
POC LYMPH %: 36 % (ref 10–50)
POC MID %: 5.7 %M (ref 0–12)
RBC: 5.02 M/uL (ref 4.69–6.13)
RDW, POC: 13.5 %
WBC: 5.3 10*3/uL (ref 4.6–10.2)

## 2017-04-01 LAB — POCT GLYCOSYLATED HEMOGLOBIN (HGB A1C): Hemoglobin A1C: 5.9

## 2017-04-01 LAB — TROPONIN I

## 2017-04-01 MED ORDER — FAMOTIDINE 20 MG PO TABS: ORAL_TABLET | ORAL | 1 refills | Status: DC

## 2017-04-01 MED ORDER — GI COCKTAIL ~~LOC~~
30.0000 mL | Freq: Once | ORAL | Status: AC
  Administered 2017-04-01: 30 mL via ORAL

## 2017-04-01 MED ORDER — CYCLOBENZAPRINE HCL 10 MG PO TABS: 5.0000 mg | ORAL_TABLET | Freq: Every day | ORAL | 0 refills | Status: DC

## 2017-04-01 NOTE — Progress Notes (Signed)
04/01/2017 9:33 AM   DOB: May 12, 1966 / MRN: [id number]  SUBJECTIVE:  [name] is a 51 y.o. male presenting for chest pain. He has a history of OSA and dyslipidemia. Tells me the pain is worse about two hours after eating and he describes the pain as burning and has nausea. Does take pantoprazole QAM 30 minutes before breakfast.  Associates SOB.  Denies diaphoresis, new DOE.   He has No Known Allergies.   He  has a past medical history of GERD (gastroesophageal reflux disease); High cholesterol; Hyperlipidemia; and Sleep apnea.    He  reports that he quit smoking about 10 years ago. He has never used smokeless tobacco. He reports that he drinks alcohol. He reports that he does not use drugs. He  has no sexual activity history on file. The patient  has no past surgical history on file.  His family history includes Hyperlipidemia in his father; Hypertension in his father; Stroke in his father; Thyroid disease in his mother.  Review of Systems  Constitutional: Negative for fever.  Respiratory: Positive for shortness of breath. Negative for cough, hemoptysis, sputum production and wheezing.   Cardiovascular: Positive for chest pain. Negative for palpitations, orthopnea, claudication, leg swelling and PND.  Skin: Negative for rash.  Neurological: Negative for dizziness.    The problem list and medications were reviewed and updated by myself where necessary and exist elsewhere in the encounter.   OBJECTIVE:  BP 118/76 (BP Location: Right Arm, Patient Position: Sitting, Cuff Size: Large)   Pulse (!) 54   Temp 98 F (36.7 C) (Oral)   Resp 16   Ht 5\' 7"  (1.702 m)   Wt 213 lb (96.6 kg)   SpO2 96%   BMI 33.36 kg/m   Lab Results  Component Value Date   CHOL 155 02/24/2017   HDL 43 02/24/2017   LDLCALC 79 02/24/2017   TRIG 167 (H) 02/24/2017   CHOLHDL 3.6 02/24/2017   Pulse Readings from Last 3 Encounters:  04/01/17 (!) 54  02/17/17 61  06/27/15 (!) 59   BP Readings from  Last 3 Encounters:  04/01/17 118/76  02/17/17 120/72  06/27/15 112/70    Physical Exam  Constitutional: He appears well-developed. He is active and cooperative.  Non-toxic appearance.  Cardiovascular: Normal rate, regular rhythm, S1 normal, S2 normal, normal heart sounds, intact distal pulses and normal pulses.  Exam reveals no gallop and no friction rub.   No murmur heard. Pulmonary/Chest: Effort normal. No stridor. No tachypnea. No respiratory distress. He has no wheezes. He has no rales. He exhibits tenderness.  Abdominal: He exhibits no distension.  Musculoskeletal: He exhibits no edema.  Neurological: He is alert.  Skin: Skin is warm and dry. He is not diaphoretic. No pallor.  Vitals reviewed.   Lab Results  Component Value Date   HGBA1C 5.9 04/01/2017   Lab Results  Component Value Date   ALT 29 02/24/2017   AST 23 02/24/2017   ALKPHOS 59 02/24/2017   BILITOT 0.6 02/24/2017   Lab Results  Component Value Date   TSH 1.865 04/27/2014    Results for orders placed or performed in visit on 04/01/17 (from the past 72 hour(s))  POCT CBC     Status: Abnormal   Collection Time: 04/01/17  9:23 AM  Result Value Ref Range   WBC 5.3 4.6 - 10.2 K/uL   Lymph, poc 1.9 0.6 - 3.4   POC LYMPH PERCENT 36.0 10 - 50 %L   MID (  cbc) 0.3 0 - 0.9   POC MID % 5.7 0 - 12 %M   POC Granulocyte 3.1 2 - 6.9   Granulocyte percent 58.3 37 - 80 %G   RBC 5.02 4.69 - 6.13 M/uL   Hemoglobin 14.2 14.1 - 18.1 g/dL   HCT, POC 43.4 (A) 43.5 - 53.7 %   MCV 86.4 80 - 97 fL   MCH, POC 28.3 27 - 31.2 pg   MCHC 32.8 31.8 - 35.4 g/dL   RDW, POC 13.5 %   Platelet Count, POC 225 142 - 424 K/uL   MPV 8.1 0 - 99.8 fL  POCT glycosylated hemoglobin (Hb A1C)     Status: None   Collection Time: 04/01/17  9:29 AM  Result Value Ref Range   Hemoglobin A1C 5.9     Dg Chest 2 View  Result Date: 04/01/2017 CLINICAL DATA:  Chest pain EXAM: CHEST  2 VIEW COMPARISON:  07/28/2011 FINDINGS: Heart and mediastinal  contours are within normal limits. No focal opacities or effusions. No acute bony abnormality. IMPRESSION: No active cardiopulmonary disease. Electronically Signed   By: Rolm Baptise M.D.   On: 04/01/2017 09:20    ASSESSMENT AND PLAN:  [name] was seen today for chest pain.  Diagnoses and all orders for this visit:  Chest pain, unspecified type: Case discussed with Dr. Brigitte Pulse. Resolved with GI cocktail.  Likely a GERD etiology.  Trop pending. Will see him back for a physical exam.  -     EKG 12-Lead -     DG Chest 2 View; Future -     Troponin I -     gi cocktail (Maalox,Lidocaine,Donnatal); Take 30 mLs by mouth once. -     POCT CBC -     POCT glycosylated hemoglobin (Hb A1C)    The patient is advised to call or return to clinic if he does not see an improvement in symptoms, or to seek the care of the closest emergency department if he worsens with the above plan.   Philis Fendt, MHS, PA-C [facility] at [facility] 04/01/2017 9:33 AM

## 2017-04-01 NOTE — Patient Instructions (Addendum)
  Continue your pantoprazole and continue taking 30 minutes before breakfast.  Take famotidine at 30 minutes before supper daily for the next month. Take the muscle relaxer at night.  Take tylenol 1000 mg every 8 hours as needed.    IF you received an x-ray today, you will receive an invoice from [facility]. Please contact [facility] at [phone] with questions or concerns regarding your invoice.   IF you received labwork today, you will receive an invoice from [facility]. Please contact [facility] at [phone] with questions or concerns regarding your invoice.   Our billing staff will not be able to assist you with questions regarding bills from these companies.  You will be contacted with the lab results as soon as they are available. The fastest way to get your results is to activate your My Chart account. Instructions are located on the last page of this paperwork. If you have not heard from [place] regarding the results in 2 weeks, please contact this office.

## 2017-10-11 ENCOUNTER — Encounter: Payer: Self-pay | Admitting: Physician Assistant

## 2017-11-01 ENCOUNTER — Encounter: Payer: Self-pay | Admitting: Physician Assistant

## 2017-11-01 ENCOUNTER — Other Ambulatory Visit: Payer: Self-pay

## 2017-11-01 ENCOUNTER — Ambulatory Visit: Payer: BLUE CROSS/BLUE SHIELD | Admitting: Physician Assistant

## 2017-11-01 DIAGNOSIS — E785 Hyperlipidemia, unspecified: Secondary | ICD-10-CM

## 2017-11-01 DIAGNOSIS — K219 Gastro-esophageal reflux disease without esophagitis: Secondary | ICD-10-CM

## 2017-11-01 LAB — POCT CBC
Granulocyte percent: 59.3 %G (ref 37–80)
HEMATOCRIT: 42.2 % — AB (ref 43.5–53.7)
Hemoglobin: 13.9 g/dL — AB (ref 14.1–18.1)
LYMPH, POC: 1.8 (ref 0.6–3.4)
MCH, POC: 27.5 pg (ref 27–31.2)
MCHC: 32.8 g/dL (ref 31.8–35.4)
MCV: 83.7 fL (ref 80–97)
MID (CBC): 0.2 (ref 0–0.9)
MPV: 7.4 fL (ref 0–99.8)
POC GRANULOCYTE: 3 (ref 2–6.9)
POC LYMPH %: 36.7 % (ref 10–50)
POC MID %: 4 % (ref 0–12)
Platelet Count, POC: 241 10*3/uL (ref 142–424)
RBC: 5.04 M/uL (ref 4.69–6.13)
RDW, POC: 13.7 %
WBC: 5 10*3/uL (ref 4.6–10.2)

## 2017-11-01 MED ORDER — SIMVASTATIN 20 MG PO TABS: ORAL_TABLET | ORAL | 3 refills | Status: DC

## 2017-11-01 MED ORDER — PANTOPRAZOLE SODIUM 40 MG PO TBEC: 40.0000 mg | DELAYED_RELEASE_TABLET | Freq: Every day | ORAL | 3 refills | Status: DC

## 2017-11-01 NOTE — Progress Notes (Signed)
11/01/2017 10:39 AM   DOB: 1965/08/20 / MRN: [id number]  SUBJECTIVE:  [name] is a 52 y.o. male presenting for recheck of heartburn.  Patient is taking pantoprazole 40 mg in the morning 30 minutes before breakfast and Zantac 150 around 10 PM.  Takes Tums at night for breakthrough symptoms.  Patient reports the symptoms are still poorly controlled.  He is not opposed to seeing a GI doctor at this point.  He denies chest pain, shortness of breath, DOE, leg swelling.  Patient would like refills of his simvastatin today.  He has No Known Allergies.   He  has a past medical history of GERD (gastroesophageal reflux disease), High cholesterol, Hyperlipidemia, and Sleep apnea.    He  reports that he quit smoking about 10 years ago. He has never used smokeless tobacco. He reports that he drinks alcohol. He reports that he does not use drugs. He  has no sexual activity history on file. The patient  has no past surgical history on file.  His family history includes Hyperlipidemia in his father; Hypertension in his father; Stroke in his father; Thyroid disease in his mother.  ROS  The problem list and medications were reviewed and updated by myself where necessary and exist elsewhere in the encounter.   OBJECTIVE:  BP 112/68 (BP Location: Left Arm, Patient Position: Sitting, Cuff Size: Large)   Pulse 60   Temp 98 F (36.7 C) (Oral)   Resp 18   Ht 5\' 7"  (1.702 m)   Wt 216 lb 6.4 oz (98.2 kg)   SpO2 98%   BMI 33.89 kg/m   Physical Exam  Constitutional: He is oriented to person, place, and time. He appears well-developed. He does not appear ill.  Eyes: Pupils are equal, round, and reactive to light. Conjunctivae and EOM are normal.  Cardiovascular: Normal rate, regular rhythm, S1 normal, S2 normal, normal heart sounds, intact distal pulses and normal pulses. Exam reveals no gallop and no friction rub.  No murmur heard. Pulmonary/Chest: Effort normal. No stridor. No respiratory distress.  He has no wheezes. He has no rales.  Abdominal: He exhibits no distension.  Musculoskeletal: Normal range of motion. He exhibits no edema.  Neurological: He is alert and oriented to person, place, and time. No cranial nerve deficit. Coordination normal.  Skin: Skin is warm and dry. He is not diaphoretic.  Psychiatric: He has a normal mood and affect.  Nursing note and vitals reviewed.   Results for orders placed or performed in visit on 11/01/17 (from the past 72 hour(s))  POCT CBC     Status: Abnormal   Collection Time: 11/01/17 10:33 AM  Result Value Ref Range   WBC 5.0 4.6 - 10.2 K/uL   Lymph, poc 1.8 0.6 - 3.4   POC LYMPH PERCENT 36.7 10 - 50 %L   MID (cbc) 0.2 0 - 0.9   POC MID % 4.0 0 - 12 %M   POC Granulocyte 3.0 2 - 6.9   Granulocyte percent 59.3 37 - 80 %G   RBC 5.04 4.69 - 6.13 M/uL   Hemoglobin 13.9 (A) 14.1 - 18.1 g/dL   HCT, POC 42.2 (A) 43.5 - 53.7 %   MCV 83.7 80 - 97 fL   MCH, POC 27.5 27 - 31.2 pg   MCHC 32.8 31.8 - 35.4 g/dL   RDW, POC 13.7 %   Platelet Count, POC 241 142 - 424 K/uL   MPV 7.4 0 - 99.8 fL   CBC  Latest Ref Rng & Units 11/01/2017 04/01/2017 05/25/2015  WBC 4.6 - 10.2 K/uL 5.0 5.3 -  Hemoglobin 14.1 - 18.1 g/dL 13.9(A) 14.2 15.6  Hematocrit 43.5 - 53.7 % 42.2(A) 43.4(A) 46.0  Platelets 150 - 400 K/uL - - -     No results found.  ASSESSMENT AND PLAN:  Adarius was seen today for gastroesophageal reflux, follow-up and medication refill.  Diagnoses and all orders for this visit:  Gastroesophageal reflux disease, esophagitis presence not specified: Mild anemia noted today.  He needs to see GI for this.  I am referring him to Dr. [facility] who should be able to get the patient in fairly quickly.  I am certain they will do a scope.  I would have liked to have checked this patient for H. pylori however he does take Protonix and I have never seen him in a time when he did not take PPI thus a negative result would not rule out H. pylori.  Hopefully a  tissue biopsy can be helpful in this regard. -     POCT CBC -     pantoprazole (PROTONIX) 40 MG tablet; Take 1 tablet (40 mg total) by mouth daily. For heartburn and reflux -     Ambulatory referral to Gastroenterology  Hyperlipidemia, unspecified hyperlipidemia type -     Lipid Panel -     simvastatin (ZOCOR) 20 MG tablet; TAKE 1 TABLET BY MOUTH EVERY DAY EVERY EVENING    The patient is advised to call or return to clinic if he does not see an improvement in symptoms, or to seek the care of the closest emergency department if he worsens with the above plan.   Philis Fendt, MHS, PA-C [facility] at [facility] 11/01/2017 10:39 AM

## 2017-11-01 NOTE — Patient Instructions (Signed)
     IF you received an x-ray today, you will receive an invoice from [facility]. Please contact [facility] at [phone] with questions or concerns regarding your invoice.   IF you received labwork today, you will receive an invoice from [facility]. Please contact [facility] at [phone] with questions or concerns regarding your invoice.   Our billing staff will not be able to assist you with questions regarding bills from these companies.  You will be contacted with the lab results as soon as they are available. The fastest way to get your results is to activate your My Chart account. Instructions are located on the last page of this paperwork. If you have not heard from us regarding the results in 2 weeks, please contact this office.     

## 2017-11-02 LAB — LIPID PANEL
Chol/HDL Ratio: 3.3 ratio (ref 0.0–5.0)
Cholesterol, Total: 143 mg/dL (ref 100–199)
HDL: 43 mg/dL (ref >39)
LDL Calculated: 78 mg/dL (ref 0–99)
Triglycerides: 110 mg/dL (ref 0–149)
VLDL CHOLESTEROL CAL: 22 mg/dL (ref 5–40)

## 2017-11-17 DIAGNOSIS — K219 Gastro-esophageal reflux disease without esophagitis: Secondary | ICD-10-CM | POA: Diagnosis not present

## 2017-11-22 ENCOUNTER — Encounter: Payer: BLUE CROSS/BLUE SHIELD | Admitting: Physician Assistant

## 2017-11-24 ENCOUNTER — Other Ambulatory Visit: Payer: Self-pay

## 2017-11-24 ENCOUNTER — Ambulatory Visit (INDEPENDENT_AMBULATORY_CARE_PROVIDER_SITE_OTHER): Payer: BLUE CROSS/BLUE SHIELD | Admitting: Physician Assistant

## 2017-11-24 ENCOUNTER — Encounter: Payer: Self-pay | Admitting: Physician Assistant

## 2017-11-24 VITALS — BP 100/72 | HR 60 | Temp 97.6°F | Resp 18 | Ht 67.0 in | Wt 211.0 lb

## 2017-11-24 DIAGNOSIS — Z113 Encounter for screening for infections with a predominantly sexual mode of transmission: Secondary | ICD-10-CM | POA: Diagnosis not present

## 2017-11-24 DIAGNOSIS — Z13228 Encounter for screening for other metabolic disorders: Secondary | ICD-10-CM | POA: Diagnosis not present

## 2017-11-24 DIAGNOSIS — Z1329 Encounter for screening for other suspected endocrine disorder: Secondary | ICD-10-CM

## 2017-11-24 DIAGNOSIS — Z125 Encounter for screening for malignant neoplasm of prostate: Secondary | ICD-10-CM

## 2017-11-24 DIAGNOSIS — Z130 Encounter for screening for diseases of the blood and blood-forming organs and certain disorders involving the immune mechanism: Secondary | ICD-10-CM | POA: Diagnosis not present

## 2017-11-24 DIAGNOSIS — Z1321 Encounter for screening for nutritional disorder: Secondary | ICD-10-CM

## 2017-11-24 DIAGNOSIS — Z0000 Encounter for general adult medical examination without abnormal findings: Secondary | ICD-10-CM

## 2017-11-24 DIAGNOSIS — Z114 Encounter for screening for human immunodeficiency virus [HIV]: Secondary | ICD-10-CM | POA: Diagnosis not present

## 2017-11-24 LAB — POCT URINALYSIS DIP (MANUAL ENTRY)
BILIRUBIN UA: NEGATIVE
BILIRUBIN UA: NEGATIVE mg/dL
Blood, UA: NEGATIVE
GLUCOSE UA: NEGATIVE mg/dL
Leukocytes, UA: NEGATIVE
Nitrite, UA: NEGATIVE
Protein Ur, POC: NEGATIVE mg/dL
SPEC GRAV UA: 1.015 (ref 1.010–1.025)
UROBILINOGEN UA: 0.2 U/dL
pH, UA: 7.5 (ref 5.0–8.0)

## 2017-11-24 NOTE — Progress Notes (Signed)
11/24/2017 9:34 AM   DOB: 07/12/1966 / MRN: [id number]  SUBJECTIVE:  [name] is a 52 y.o. male presenting for GERD dyslipidemia on chronic statin therapy, sleep apnea compliant with CPAP.  He has a history of smoking and quit 10 years ago. No urinary symptoms.  Feels well today and denies complaint.  He has No Known Allergies.   He  has a past medical history of GERD (gastroesophageal reflux disease), High cholesterol, Hyperlipidemia, and Sleep apnea.    He  reports that he quit smoking about 10 years ago. He has never used smokeless tobacco. He reports that he drinks alcohol. He reports that he does not use drugs. He  has no sexual activity history on file. The patient  has no past surgical history on file.  His family history includes Hyperlipidemia in his father and mother; Hypertension in his father; Stroke in his father and mother; Thyroid disease in his mother.  Review of Systems  Constitutional: Negative for chills, diaphoresis and fever.  Eyes: Negative.   Respiratory: Negative for cough, hemoptysis, sputum production, shortness of breath and wheezing.   Cardiovascular: Negative for chest pain, orthopnea and leg swelling.  Gastrointestinal: Negative for abdominal pain, blood in stool, constipation, diarrhea, heartburn, melena, nausea and vomiting.  Genitourinary: Negative for dysuria, flank pain, frequency, hematuria and urgency.  Skin: Negative for rash.  Neurological: Negative for dizziness, sensory change, speech change, focal weakness and headaches.    The problem list and medications were reviewed and updated by myself where necessary and exist elsewhere in the encounter.   OBJECTIVE:  BP 100/72 (BP Location: Left Arm, Patient Position: Sitting, Cuff Size: Normal)   Pulse 60   Temp 97.6 F (36.4 C) (Oral)   Resp 18   Ht 5\' 7"  (1.702 m)   Wt 211 lb (95.7 kg)   SpO2 98%   BMI 33.05 kg/m   Physical Exam  Constitutional: He is oriented to person, place, and  time. He appears well-developed. He is active.  Non-toxic appearance. He does not appear ill.  Eyes: Pupils are equal, round, and reactive to light. Conjunctivae and EOM are normal.  Cardiovascular: Normal rate, regular rhythm, S1 normal, S2 normal, normal heart sounds, intact distal pulses and normal pulses. Exam reveals no gallop and no friction rub.  No murmur heard. Pulmonary/Chest: Effort normal. No stridor. No respiratory distress. He has no wheezes. He has no rales.  Abdominal: He exhibits no distension.  Musculoskeletal: Normal range of motion. He exhibits no edema.  Neurological: He is alert and oriented to person, place, and time. He has normal strength and normal reflexes. He is not disoriented. No cranial nerve deficit or sensory deficit. He exhibits normal muscle tone. Coordination and gait normal.  Skin: Skin is warm and dry. He is not diaphoretic. No pallor.  Psychiatric: He has a normal mood and affect. His behavior is normal.  Nursing note and vitals reviewed.   The 10-year ASCVD risk score Mikey Bussing DC Brooke Bonito., et al., 2013) is: 1.5%   Values used to calculate the score:     Age: 43 years     Sex: Male     Is Non-Hispanic African American: No     Diabetic: No     Tobacco smoker: No     Systolic Blood Pressure: 456 mmHg     Is BP treated: No     HDL Cholesterol: 43 mg/dL     Total Cholesterol: 143 mg/dL   No results found for this  or any previous visit (from the past 72 hour(s)).  No results found.  ASSESSMENT AND PLAN:  [name] was seen today for annual exam.  Diagnoses and all orders for this visit:  Annual physical exam  Screening for endocrine, nutritional, metabolic and immunity disorder -     Hemoglobin A1c -     CBC -     Basic Metabolic Panel -     TSH -     PSA -     Hepatic Function Panel -     Urinalysis, dipstick only  Screening for HIV (human immunodeficiency virus) -     HIV antibody  Routine screening for STI (sexually transmitted infection) -      GC/Chlamydia Probe Amp    The patient is advised to call or return to clinic if he does not see an improvement in symptoms, or to seek the care of the closest emergency department if he worsens with the above plan.   Philis Fendt, MHS, PA-C [facility] at [facility] 11/24/2017 9:34 AM

## 2017-11-24 NOTE — Patient Instructions (Signed)
     IF you received an x-ray today, you will receive an invoice from [facility]. Please contact [facility] at [phone] with questions or concerns regarding your invoice.   IF you received labwork today, you will receive an invoice from [facility]. Please contact [facility] at [phone] with questions or concerns regarding your invoice.   Our billing staff will not be able to assist you with questions regarding bills from these companies.  You will be contacted with the lab results as soon as they are available. The fastest way to get your results is to activate your My Chart account. Instructions are located on the last page of this paperwork. If you have not heard from us regarding the results in 2 weeks, please contact this office.     

## 2017-11-24 NOTE — Addendum Note (Signed)
Addended by: Suszanne Finch on: 11/24/2017 10:00 AM   Modules accepted: Orders

## 2017-11-25 LAB — TSH: TSH: 2.99 u[IU]/mL (ref 0.450–4.500)

## 2017-11-25 LAB — CBC
HEMATOCRIT: 43.8 % (ref 37.5–51.0)
HEMOGLOBIN: 14.3 g/dL (ref 13.0–17.7)
MCH: 28.2 pg (ref 26.6–33.0)
MCHC: 32.6 g/dL (ref 31.5–35.7)
MCV: 86 fL (ref 79–97)
Platelets: 229 10*3/uL (ref 150–379)
RBC: 5.07 x10E6/uL (ref 4.14–5.80)
RDW: 14.4 % (ref 12.3–15.4)
WBC: 4.4 10*3/uL (ref 3.4–10.8)

## 2017-11-25 LAB — HEPATIC FUNCTION PANEL
ALT: 26 IU/L (ref 0–44)
AST: 19 IU/L (ref 0–40)
Albumin: 4.7 g/dL (ref 3.5–5.5)
Alkaline Phosphatase: 66 IU/L (ref 39–117)
BILIRUBIN, DIRECT: 0.14 mg/dL (ref 0.00–0.40)
Bilirubin Total: 0.6 mg/dL (ref 0.0–1.2)
TOTAL PROTEIN: 7.8 g/dL (ref 6.0–8.5)

## 2017-11-25 LAB — BASIC METABOLIC PANEL
BUN/Creatinine Ratio: 12 (ref 9–20)
BUN: 12 mg/dL (ref 6–24)
CALCIUM: 9.7 mg/dL (ref 8.7–10.2)
CO2: 24 mmol/L (ref 20–29)
Chloride: 100 mmol/L (ref 96–106)
Creatinine, Ser: 1.03 mg/dL (ref 0.76–1.27)
GFR calc Af Amer: 98 mL/min/{1.73_m2} (ref >59)
GFR, EST NON AFRICAN AMERICAN: 85 mL/min/{1.73_m2} (ref >59)
Glucose: 84 mg/dL (ref 65–99)
POTASSIUM: 4.2 mmol/L (ref 3.5–5.2)
Sodium: 141 mmol/L (ref 134–144)

## 2017-11-25 LAB — GC/CHLAMYDIA PROBE AMP
Chlamydia trachomatis, NAA: NEGATIVE
NEISSERIA GONORRHOEAE BY PCR: NEGATIVE

## 2017-11-25 LAB — HIV ANTIBODY (ROUTINE TESTING W REFLEX): HIV SCREEN 4TH GENERATION: NONREACTIVE

## 2017-11-25 LAB — HEMOGLOBIN A1C
ESTIMATED AVERAGE GLUCOSE: 114 mg/dL
Hgb A1c MFr Bld: 5.6 % (ref 4.8–5.6)

## 2017-11-25 LAB — PSA: Prostate Specific Ag, Serum: 0.6 ng/mL (ref 0.0–4.0)

## 2017-11-30 ENCOUNTER — Encounter: Payer: Self-pay | Admitting: *Deleted

## 2017-11-30 DIAGNOSIS — R12 Heartburn: Secondary | ICD-10-CM | POA: Diagnosis not present

## 2017-11-30 DIAGNOSIS — K2950 Unspecified chronic gastritis without bleeding: Secondary | ICD-10-CM | POA: Diagnosis not present

## 2017-11-30 DIAGNOSIS — K219 Gastro-esophageal reflux disease without esophagitis: Secondary | ICD-10-CM | POA: Diagnosis not present

## 2017-12-13 ENCOUNTER — Telehealth: Payer: Self-pay | Admitting: Physician Assistant

## 2017-12-13 NOTE — Telephone Encounter (Signed)
Copied from [place] [id number]. Topic: Inquiry >> Dec 13, 2017  3:49 PM Margot Ables wrote: Reason for CRM: Pt is requesting copy of his lipid panel from 11/01/17 to be mailed to him. It is required by his job. Verified address. [address] [place] [address]

## 2017-12-13 NOTE — Telephone Encounter (Signed)
Letter sent.

## 2018-01-17 DIAGNOSIS — K219 Gastro-esophageal reflux disease without esophagitis: Secondary | ICD-10-CM | POA: Diagnosis not present

## 2018-02-09 ENCOUNTER — Telehealth: Payer: Self-pay | Admitting: Physician Assistant

## 2018-04-23 DIAGNOSIS — H43393 Other vitreous opacities, bilateral: Secondary | ICD-10-CM | POA: Diagnosis not present

## 2018-04-23 DIAGNOSIS — H40033 Anatomical narrow angle, bilateral: Secondary | ICD-10-CM | POA: Diagnosis not present

## 2018-05-05 NOTE — Telephone Encounter (Signed)
DONE

## 2018-05-27 ENCOUNTER — Ambulatory Visit: Payer: BLUE CROSS/BLUE SHIELD | Admitting: Physician Assistant

## 2018-05-28 ENCOUNTER — Other Ambulatory Visit: Payer: Self-pay

## 2018-05-28 ENCOUNTER — Ambulatory Visit: Payer: BLUE CROSS/BLUE SHIELD | Admitting: Emergency Medicine

## 2018-05-28 ENCOUNTER — Encounter: Payer: Self-pay | Admitting: Emergency Medicine

## 2018-05-28 VITALS — BP 125/78 | HR 72 | Temp 98.0°F | Resp 16 | Ht 69.29 in | Wt 214.0 lb

## 2018-05-28 DIAGNOSIS — G4733 Obstructive sleep apnea (adult) (pediatric): Secondary | ICD-10-CM

## 2018-05-28 DIAGNOSIS — M722 Plantar fascial fibromatosis: Secondary | ICD-10-CM

## 2018-05-28 NOTE — Patient Instructions (Addendum)
If you have lab work done today you will be contacted with your lab results within the next 2 weeks.  If you have not heard from [place] then please contact us. The fastest way to get your results is to register for My Chart.   IF you received an x-ray today, you will receive an invoice from [facility]. Please contact [facility] at [phone] with questions or concerns regarding your invoice.   IF you received labwork today, you will receive an invoice from [facility]. Please contact [facility] at [phone] with questions or concerns regarding your invoice.   Our billing staff will not be able to assist you with questions regarding bills from these companies.  You will be contacted with the lab results as soon as they are available. The fastest way to get your results is to activate your My Chart account. Instructions are located on the last page of this paperwork. If you have not heard from [place] regarding the results in 2 weeks, please contact this office.      Plantar Fasciitis Plantar fasciitis is a painful foot condition that affects the heel. It occurs when the band of tissue that connects the toes to the heel bone (plantar fascia) becomes irritated. This can happen after exercising too much or doing other repetitive activities (overuse injury). The pain from plantar fasciitis can range from mild irritation to severe pain that makes it difficult for you to walk or move. The pain is usually worse in the morning or after you have been sitting or lying down for a while. What are the causes? This condition may be caused by:  Standing for long periods of time.  Wearing shoes that do not fit.  Doing high-impact activities, including running, aerobics, and ballet.  Being overweight.  Having an abnormal way of walking (gait).  Having tight calf muscles.  Having high arches in your feet.  Starting a new athletic activity.  What are the signs or symptoms? The main  symptom of this condition is heel pain. Other symptoms include:  Pain that gets worse after activity or exercise.  Pain that is worse in the morning or after resting.  Pain that goes away after you walk for a few minutes.  How is this diagnosed? This condition may be diagnosed based on your signs and symptoms. Your health care provider will also do a physical exam to check for:  A tender area on the bottom of your foot.  A high arch in your foot.  Pain when you move your foot.  Difficulty moving your foot.  You may also need to have imaging studies to confirm the diagnosis. These can include:  X-rays.  Ultrasound.  MRI.  How is this treated? Treatment for plantar fasciitis depends on the severity of the condition. Your treatment may include:  Rest, ice, and over-the-counter pain medicines to manage your pain.  Exercises to stretch your calves and your plantar fascia.  A splint that holds your foot in a stretched, upward position while you sleep (night splint).  Physical therapy to relieve symptoms and prevent problems in the future.  Cortisone injections to relieve severe pain.  Extracorporeal shock wave therapy (ESWT) to stimulate damaged plantar fascia with electrical impulses. It is often used as a last resort before surgery.  Surgery, if other treatments have not worked after 12 months.  Follow these instructions at home:  Take medicines only as directed by your health care provider.  Avoid activities that cause  pain.  Roll the bottom of your foot over a bag of ice or a bottle of cold water. Do this for 20 minutes, 3-4 times a day.  Perform simple stretches as directed by your health care provider.  Try wearing athletic shoes with air-sole or gel-sole cushions or soft shoe inserts.  Wear a night splint while sleeping, if directed by your health care provider.  Keep all follow-up appointments with your health care provider. How is this prevented?  Do  not perform exercises or activities that cause heel pain.  Consider finding low-impact activities if you continue to have problems.  Lose weight if you need to. The best way to prevent plantar fasciitis is to avoid the activities that aggravate your plantar fascia. Contact a health care provider if:  Your symptoms do not go away after treatment with home care measures.  Your pain gets worse.  Your pain affects your ability to move or do your daily activities. This information is not intended to replace advice given to you by your health care provider. Make sure you discuss any questions you have with your health care provider. Document Released: 03/24/2001 Document Revised: 12/02/2015 Document Reviewed: 05/09/2014 Elsevier Interactive Patient Education  [employer].

## 2018-05-28 NOTE — Progress Notes (Signed)
[name] 52 y.o.   Chief Complaint  Patient presents with  . Foot Pain    right foot pain z 2 weeks from working. Soles of the foot   . Sleep Apnea    need new cpap machine     HISTORY OF PRESENT ILLNESS: This is a 52 y.o. male complaining of bilateral foot pain for 1 year. Also has a history of sleep apnea and is requesting prescription for a new CPAP machine.   HPI   Prior to Admission medications   Medication Sig Start Date End Date Taking? Authorizing Provider  pantoprazole (PROTONIX) 40 MG tablet Take 1 tablet (40 mg total) by mouth daily. For heartburn and reflux 11/01/17  Yes Tereasa Coop, PA-C  simvastatin (ZOCOR) 20 MG tablet TAKE 1 TABLET BY MOUTH EVERY DAY EVERY EVENING 11/01/17  Yes Tereasa Coop, PA-C    No Known Allergies  Patient Active Problem List   Diagnosis Date Noted  . OSA (obstructive sleep apnea) 06/27/2015  . Carpal tunnel syndrome, bilateral 04/29/2012    Past Medical History:  Diagnosis Date  . GERD (gastroesophageal reflux disease)   . High cholesterol   . Hyperlipidemia   . Sleep apnea     History reviewed. No pertinent surgical history.  Social History   Socioeconomic History  . Marital status: Married    Spouse name: Not on file  . Number of children: Not on file  . Years of education: Not on file  . Highest education level: Not on file  Occupational History  . Not on file  Social Needs  . Financial resource strain: Not on file  . Food insecurity:    Worry: Not on file    Inability: Not on file  . Transportation needs:    Medical: Not on file    Non-medical: Not on file  Tobacco Use  . Smoking status: Former Smoker    Last attempt to quit: 11/30/2006    Years since quitting: 11.4  . Smokeless tobacco: Never Used  Substance and Sexual Activity  . Alcohol use: Yes  . Drug use: No  . Sexual activity: Not on file  Lifestyle  . Physical activity:    Days per week: Not on file    Minutes per session: Not on file   . Stress: Not on file  Relationships  . Social connections:    Talks on phone: Not on file    Gets together: Not on file    Attends religious service: Not on file    Active member of club or organization: Not on file    Attends meetings of clubs or organizations: Not on file    Relationship status: Not on file  . Intimate partner violence:    Fear of current or ex partner: Not on file    Emotionally abused: Not on file    Physically abused: Not on file    Forced sexual activity: Not on file  Other Topics Concern  . Not on file  Social History Narrative   ** Merged History Encounter **        Family History  Problem Relation Age of Onset  . Thyroid disease Mother   . Hyperlipidemia Mother   . Stroke Mother   . Hyperlipidemia Father   . Hypertension Father   . Stroke Father      Review of Systems  Constitutional: Negative.  Negative for chills and fever.  HENT: Negative.   Eyes: Negative.   Respiratory: Negative  for cough and shortness of breath.   Cardiovascular: Negative for chest pain and palpitations.  Gastrointestinal: Negative for abdominal pain, nausea and vomiting.  Genitourinary: Negative.   Skin: Negative.  Negative for rash.  Neurological: Negative.  Negative for dizziness and headaches.  Endo/Heme/Allergies: Negative.   All other systems reviewed and are negative.   Vitals:   05/28/18 1126  BP: 125/78  Pulse: 72  Resp: 16  Temp: 98 F (36.7 C)  SpO2: 100%    Physical Exam  Constitutional: He is oriented to person, place, and time. He appears well-developed and well-nourished.  HENT:  Head: Normocephalic and atraumatic.  Eyes: Pupils are equal, round, and reactive to light. EOM are normal.  Neck: Normal range of motion. Neck supple.  Cardiovascular: Normal rate and regular rhythm.  Pulmonary/Chest: Effort normal and breath sounds normal.  Abdominal: Soft.  Musculoskeletal: Normal range of motion.  Feet: No erythema or bruising.  No  significant swelling.  No significant tenderness.  Full range of motion.  Neurological: He is alert and oriented to person, place, and time.  Skin: Skin is warm and dry. Capillary refill takes less than 2 seconds.  Psychiatric: He has a normal mood and affect. His behavior is normal.  Vitals reviewed.    ASSESSMENT & PLAN: [name] was seen today for foot pain and sleep apnea.  Diagnoses and all orders for this visit:  Plantar fasciitis, bilateral -     Ambulatory referral to Podiatry  Obstructive sleep apnea -     CPAP; Future    Patient Instructions       If you have lab work done today you will be contacted with your lab results within the next 2 weeks.  If you have not heard from [place] then please contact us. The fastest way to get your results is to register for My Chart.   IF you received an x-ray today, you will receive an invoice from [facility]. Please contact [facility] at [phone] with questions or concerns regarding your invoice.   IF you received labwork today, you will receive an invoice from [place]. Please contact [facility] at [phone] with questions or concerns regarding your invoice.   Our billing staff will not be able to assist you with questions regarding bills from these companies.  You will be contacted with the lab results as soon as they are available. The fastest way to get your results is to activate your My Chart account. Instructions are located on the last page of this paperwork. If you have not heard from [place] regarding the results in 2 weeks, please contact this office.      Plantar Fasciitis Plantar fasciitis is a painful foot condition that affects the heel. It occurs when the band of tissue that connects the toes to the heel bone (plantar fascia) becomes irritated. This can happen after exercising too much or doing other repetitive activities (overuse injury). The pain from plantar fasciitis can range from mild irritation  to severe pain that makes it difficult for you to walk or move. The pain is usually worse in the morning or after you have been sitting or lying down for a while. What are the causes? This condition may be caused by:  Standing for long periods of time.  Wearing shoes that do not fit.  Doing high-impact activities, including running, aerobics, and ballet.  Being overweight.  Having an abnormal way of walking (gait).  Having tight calf muscles.  Having high arches in your feet.  Starting a new athletic activity.  What are the signs or symptoms? The main symptom of this condition is heel pain. Other symptoms include:  Pain that gets worse after activity or exercise.  Pain that is worse in the morning or after resting.  Pain that goes away after you walk for a few minutes.  How is this diagnosed? This condition may be diagnosed based on your signs and symptoms. Your health care provider will also do a physical exam to check for:  A tender area on the bottom of your foot.  A high arch in your foot.  Pain when you move your foot.  Difficulty moving your foot.  You may also need to have imaging studies to confirm the diagnosis. These can include:  X-rays.  Ultrasound.  MRI.  How is this treated? Treatment for plantar fasciitis depends on the severity of the condition. Your treatment may include:  Rest, ice, and over-the-counter pain medicines to manage your pain.  Exercises to stretch your calves and your plantar fascia.  A splint that holds your foot in a stretched, upward position while you sleep (night splint).  Physical therapy to relieve symptoms and prevent problems in the future.  Cortisone injections to relieve severe pain.  Extracorporeal shock wave therapy (ESWT) to stimulate damaged plantar fascia with electrical impulses. It is often used as a last resort before surgery.  Surgery, if other treatments have not worked after 12 months.  Follow these  instructions at home:  Take medicines only as directed by your health care provider.  Avoid activities that cause pain.  Roll the bottom of your foot over a bag of ice or a bottle of cold water. Do this for 20 minutes, 3-4 times a day.  Perform simple stretches as directed by your health care provider.  Try wearing athletic shoes with air-sole or gel-sole cushions or soft shoe inserts.  Wear a night splint while sleeping, if directed by your health care provider.  Keep all follow-up appointments with your health care provider. How is this prevented?  Do not perform exercises or activities that cause heel pain.  Consider finding low-impact activities if you continue to have problems.  Lose weight if you need to. The best way to prevent plantar fasciitis is to avoid the activities that aggravate your plantar fascia. Contact a health care provider if:  Your symptoms do not go away after treatment with home care measures.  Your pain gets worse.  Your pain affects your ability to move or do your daily activities. This information is not intended to replace advice given to you by your health care provider. Make sure you discuss any questions you have with your health care provider. Document Released: 03/24/2001 Document Revised: 12/02/2015 Document Reviewed: 05/09/2014 Elsevier Interactive Patient Education  2018 Elsevier Inc.      Agustina Caroli, MD [facility]

## 2018-05-30 ENCOUNTER — Telehealth: Payer: Self-pay | Admitting: *Deleted

## 2018-05-30 NOTE — Telephone Encounter (Signed)
Faxed documents and order for CPAP machine. Confirmation page received at 12:16 pm.

## 2018-06-07 ENCOUNTER — Other Ambulatory Visit: Payer: Self-pay | Admitting: Emergency Medicine

## 2018-06-07 ENCOUNTER — Telehealth: Payer: Self-pay | Admitting: *Deleted

## 2018-06-07 DIAGNOSIS — Z8669 Personal history of other diseases of the nervous system and sense organs: Secondary | ICD-10-CM

## 2018-06-07 NOTE — Telephone Encounter (Signed)
Tiffany at [facility] called earlier to have office notes or amended notes to be completed for patient's CPAP machine. I advised Dr Mitchel Honour and he put in a referral for patient to see a Pulmonary specialist. The specialist can order a sleep study, I called Tiffany to advise her and she cancelled the order for CPAP machine until study is done.

## 2018-06-20 ENCOUNTER — Telehealth: Payer: Self-pay | Admitting: Emergency Medicine

## 2018-06-20 NOTE — Telephone Encounter (Unsigned)
Copied from [place] [id number]. Topic: Quick Communication - Home Health Verbal Orders >> Jun 20, 2018  3:00 PM Ivar Drape wrote: Caller/Agency:   Tiffany w/[facility] Callback Number: [phone] Requesting OT/PT/Skilled Nursing/Social Work:  Needs PSG sleep study, and chart notes prior to that sleep study.  The notes needs to show the doctor who ordered that sleep study.  Please call. Frequency:  Would like to talk to [facility]

## 2018-06-23 NOTE — Telephone Encounter (Signed)
Patient called to request information on Cpap machine. He is requesting a call back at Ph# [phone] with information on when or if he need a sleep study. Patient say he cant wait too much longer to get this machine because of the issues he is having at night.

## 2018-06-24 NOTE — Telephone Encounter (Signed)
Patient was called and given Dr. Mitchel Honour information.  Patient understood.

## 2018-06-24 NOTE — Telephone Encounter (Signed)
We did put in an order for a CPAP machine during the last visit.  We do not have any notes regarding his sleep studies or initial diagnosis so I strongly recommend he follows up with pulmonary doctor who initially tested him and request their notes.  We did not diagnose him with sleep apnea initially.  Thanks.

## 2018-06-28 NOTE — Telephone Encounter (Signed)
Tiffany from [facility] called back and was informed of information mentioned below and C pap information mentioned on 06/20/18 telephone encounter, call back is no longer needed.

## 2018-06-28 NOTE — Telephone Encounter (Signed)
Caller name: Tiffany   Relation to pt:  [facility]  Call back number: [phone]    Reason for call:  Tiffany would like to follow up with Caren Griffins regarding the message below, please advise

## 2018-11-21 ENCOUNTER — Other Ambulatory Visit: Payer: Self-pay | Admitting: Physician Assistant

## 2018-11-21 DIAGNOSIS — K219 Gastro-esophageal reflux disease without esophagitis: Secondary | ICD-10-CM

## 2019-02-10 DIAGNOSIS — H40033 Anatomical narrow angle, bilateral: Secondary | ICD-10-CM | POA: Diagnosis not present

## 2019-02-10 DIAGNOSIS — H43393 Other vitreous opacities, bilateral: Secondary | ICD-10-CM | POA: Diagnosis not present

## 2019-11-26 DIAGNOSIS — G4730 Sleep apnea, unspecified: Secondary | ICD-10-CM | POA: Diagnosis not present

## 2019-12-28 DIAGNOSIS — G4733 Obstructive sleep apnea (adult) (pediatric): Secondary | ICD-10-CM | POA: Diagnosis not present

## 2020-01-27 DIAGNOSIS — G4733 Obstructive sleep apnea (adult) (pediatric): Secondary | ICD-10-CM | POA: Diagnosis not present

## 2020-02-27 DIAGNOSIS — G4733 Obstructive sleep apnea (adult) (pediatric): Secondary | ICD-10-CM | POA: Diagnosis not present

## 2020-03-26 ENCOUNTER — Ambulatory Visit (INDEPENDENT_AMBULATORY_CARE_PROVIDER_SITE_OTHER): Payer: BC Managed Care – PPO | Admitting: Emergency Medicine

## 2020-03-26 ENCOUNTER — Other Ambulatory Visit: Payer: Self-pay

## 2020-03-26 ENCOUNTER — Encounter: Payer: Self-pay | Admitting: Emergency Medicine

## 2020-03-26 VITALS — BP 123/68 | HR 66 | Temp 98.5°F | Resp 16 | Ht 68.28 in | Wt 216.0 lb

## 2020-03-26 DIAGNOSIS — G4733 Obstructive sleep apnea (adult) (pediatric): Secondary | ICD-10-CM

## 2020-03-26 DIAGNOSIS — E785 Hyperlipidemia, unspecified: Secondary | ICD-10-CM | POA: Diagnosis not present

## 2020-03-26 DIAGNOSIS — Z9989 Dependence on other enabling machines and devices: Secondary | ICD-10-CM

## 2020-03-26 DIAGNOSIS — Z6832 Body mass index (BMI) 32.0-32.9, adult: Secondary | ICD-10-CM | POA: Diagnosis not present

## 2020-03-26 DIAGNOSIS — Z23 Encounter for immunization: Secondary | ICD-10-CM

## 2020-03-26 DIAGNOSIS — Z1211 Encounter for screening for malignant neoplasm of colon: Secondary | ICD-10-CM

## 2020-03-26 MED ORDER — SIMVASTATIN 20 MG PO TABS: ORAL_TABLET | ORAL | 3 refills | Status: DC

## 2020-03-26 NOTE — Progress Notes (Signed)
Frederic K Briuh 54 y.o.   Chief Complaint  Patient presents with  . Referral    patient received letter to have a colonoscopy  . Medication Refill    Simvastatin    HISTORY OF PRESENT ILLNESS: This is a 54 y.o. male with history of dyslipidemia and sleep apnea on CPAP machine here for follow-up and medication refill. Taking simvastatin 20 mg daily. Also requesting referral for colonoscopy. No other complaints or medical concerns today. Fully vaccinated against Covid.  HPI   Prior to Admission medications   Medication Sig Start Date End Date Taking? Authorizing Provider  pantoprazole (PROTONIX) 40 MG tablet TAKE 1 TABLET BY MOUTH DAILY. FOR HEARTBURN AND REFLUX 11/22/18  Yes Horald Pollen, MD  simvastatin (ZOCOR) 20 MG tablet TAKE 1 TABLET BY MOUTH EVERY DAY EVERY EVENING 11/01/17  Yes Tereasa Coop, PA-C    Not on File  Patient Active Problem List   Diagnosis Date Noted  . OSA (obstructive sleep apnea) 06/27/2015  . Carpal tunnel syndrome, bilateral 04/29/2012    Past Medical History:  Diagnosis Date  . GERD (gastroesophageal reflux disease)   . High cholesterol   . Hyperlipidemia   . Sleep apnea     History reviewed. No pertinent surgical history.  Social History   Socioeconomic History  . Marital status: Married    Spouse name: Not on file  . Number of children: Not on file  . Years of education: Not on file  . Highest education level: Not on file  Occupational History  . Not on file  Tobacco Use  . Smoking status: Former Smoker    Quit date: 11/30/2006    Years since quitting: 13.3  . Smokeless tobacco: Never Used  Substance and Sexual Activity  . Alcohol use: Yes  . Drug use: No  . Sexual activity: Not on file  Other Topics Concern  . Not on file  Social History Narrative   ** Merged History Encounter **       Social Determinants of Health   Financial Resource Strain:   . Difficulty of Paying Living Expenses: Not on file  Food  Insecurity:   . Worried About Charity fundraiser in the Last Year: Not on file  . Ran Out of Food in the Last Year: Not on file  Transportation Needs:   . Lack of Transportation (Medical): Not on file  . Lack of Transportation (Non-Medical): Not on file  Physical Activity:   . Days of Exercise per Week: Not on file  . Minutes of Exercise per Session: Not on file  Stress:   . Feeling of Stress : Not on file  Social Connections:   . Frequency of Communication with Friends and Family: Not on file  . Frequency of Social Gatherings with Friends and Family: Not on file  . Attends Religious Services: Not on file  . Active Member of Clubs or Organizations: Not on file  . Attends Archivist Meetings: Not on file  . Marital Status: Not on file  Intimate Partner Violence:   . Fear of Current or Ex-Partner: Not on file  . Emotionally Abused: Not on file  . Physically Abused: Not on file  . Sexually Abused: Not on file    Family History  Problem Relation Age of Onset  . Thyroid disease Mother   . Hyperlipidemia Mother   . Stroke Mother   . Hyperlipidemia Father   . Hypertension Father   . Stroke Father  Review of Systems  Constitutional: Negative.  Negative for chills and fever.  HENT: Negative.  Negative for congestion and sore throat.   Respiratory: Negative.  Negative for cough and shortness of breath.   Cardiovascular: Negative.  Negative for chest pain and palpitations.  Gastrointestinal: Negative.  Negative for abdominal pain, blood in stool, diarrhea, melena, nausea and vomiting.  Genitourinary: Negative.  Negative for dysuria and hematuria.  Musculoskeletal: Negative.  Negative for back pain, myalgias and neck pain.  Skin: Negative.  Negative for rash.  Neurological: Negative.  Negative for dizziness and headaches.  Endo/Heme/Allergies: Negative.   All other systems reviewed and are negative.  Today's Vitals   03/26/20 0847  BP: 123/68  Pulse: 66  Resp:  16  Temp: 98.5 F (36.9 C)  TempSrc: Temporal  SpO2: 97%  Weight: 216 lb (98 kg)  Height: 5' 8.28" (1.734 m)   Body mass index is 32.57 kg/m.   Physical Exam Vitals reviewed.  Constitutional:      Appearance: Normal appearance.  HENT:     Head: Normocephalic.  Eyes:     Extraocular Movements: Extraocular movements intact.     Pupils: Pupils are equal, round, and reactive to light.  Cardiovascular:     Rate and Rhythm: Normal rate and regular rhythm.     Pulses: Normal pulses.     Heart sounds: Normal heart sounds.  Pulmonary:     Effort: Pulmonary effort is normal.     Breath sounds: Normal breath sounds.  Musculoskeletal:        General: Normal range of motion.     Cervical back: Normal range of motion and neck supple.  Skin:    General: Skin is warm and dry.     Capillary Refill: Capillary refill takes less than 2 seconds.  Neurological:     General: No focal deficit present.     Mental Status: He is alert and oriented to person, place, and time.  Psychiatric:        Mood and Affect: Mood normal.        Behavior: Behavior normal.      ASSESSMENT & PLAN: [name] was seen today for referral and medication refill.  Diagnoses and all orders for this visit:  Dyslipidemia -     simvastatin (ZOCOR) 20 MG tablet; TAKE 1 TABLET BY MOUTH EVERY DAY EVERY EVENING -     Comprehensive metabolic panel -     Lipid panel -     Hemoglobin A1c  Obstructive sleep apnea on CPAP  Body mass index (BMI) of 32.0-32.9 in adult  Need for prophylactic vaccination and inoculation against influenza -     Flu Vaccine QUAD 36+ mos IM  Need for diphtheria-tetanus-pertussis (Tdap) vaccine -     Tdap vaccine greater than or equal to 7yo IM  Colon cancer screening -     Ambulatory referral to Gastroenterology    Patient Instructions       If you have lab work done today you will be contacted with your lab results within the next 2 weeks.  If you have not heard from [place] then  please contact us. The fastest way to get your results is to register for My Chart.   IF you received an x-ray today, you will receive an invoice from [facility]. Please contact [facility] at [phone] with questions or concerns regarding your invoice.   IF you received labwork today, you will receive an invoice from [facility]. Please contact [facility] at [phone]  with questions or concerns regarding your invoice.   Our billing staff will not be able to assist you with questions regarding bills from these companies.  You will be contacted with the lab results as soon as they are available. The fastest way to get your results is to activate your My Chart account. Instructions are located on the last page of this paperwork. If you have not heard from [place] regarding the results in 2 weeks, please contact this office.     Health Maintenance, Male Adopting a healthy lifestyle and getting preventive care are important in promoting health and wellness. Ask your health care provider about:  The right schedule for you to have regular tests and exams.  Things you can do on your own to prevent diseases and keep yourself healthy. What should I know about diet, weight, and exercise? Eat a healthy diet   Eat a diet that includes plenty of vegetables, fruits, low-fat dairy products, and lean protein.  Do not eat a lot of foods that are high in solid fats, added sugars, or sodium. Maintain a healthy weight Body mass index (BMI) is a measurement that can be used to identify possible weight problems. It estimates body fat based on height and weight. Your health care provider can help determine your BMI and help you achieve or maintain a healthy weight. Get regular exercise Get regular exercise. This is one of the most important things you can do for your health. Most adults should:  Exercise for at least 150 minutes each week. The exercise should increase your heart rate and make  you sweat (moderate-intensity exercise).  Do strengthening exercises at least twice a week. This is in addition to the moderate-intensity exercise.  Spend less time sitting. Even light physical activity can be beneficial. Watch cholesterol and blood lipids Have your blood tested for lipids and cholesterol at 52 years of age, then have this test every 5 years. You may need to have your cholesterol levels checked more often if:  Your lipid or cholesterol levels are high.  You are older than 52 years of age.  You are at high risk for heart disease. What should I know about cancer screening? Many types of cancers can be detected early and may often be prevented. Depending on your health history and family history, you may need to have cancer screening at various ages. This may include screening for:  Colorectal cancer.  Prostate cancer.  Skin cancer.  Lung cancer. What should I know about heart disease, diabetes, and high blood pressure? Blood pressure and heart disease  High blood pressure causes heart disease and increases the risk of stroke. This is more likely to develop in people who have high blood pressure readings, are of African descent, or are overweight.  Talk with your health care provider about your target blood pressure readings.  Have your blood pressure checked: ? Every 3-5 years if you are 34-69 years of age. ? Every year if you are 46 years old or older.  If you are between the ages of 1 and 54 and are a current or former smoker, ask your health care provider if you should have a one-time screening for abdominal aortic aneurysm (AAA). Diabetes Have regular diabetes screenings. This checks your fasting blood sugar level. Have the screening done:  Once every three years after age 21 if you are at a normal weight and have a low risk for diabetes.  More often and at a younger age if you  are overweight or have a high risk for diabetes. What should I know about  preventing infection? Hepatitis B If you have a higher risk for hepatitis B, you should be screened for this virus. Talk with your health care provider to find out if you are at risk for hepatitis B infection. Hepatitis C Blood testing is recommended for:  Everyone born from [birth year] through 1963.  Anyone with known risk factors for hepatitis C. Sexually transmitted infections (STIs)  You should be screened each year for STIs, including gonorrhea and chlamydia, if: ? You are sexually active and are younger than 52 years of age. ? You are older than 52 years of age and your health care provider tells you that you are at risk for this type of infection. ? Your sexual activity has changed since you were last screened, and you are at increased risk for chlamydia or gonorrhea. Ask your health care provider if you are at risk.  Ask your health care provider about whether you are at high risk for HIV. Your health care provider may recommend a prescription medicine to help prevent HIV infection. If you choose to take medicine to prevent HIV, you should first get tested for HIV. You should then be tested every 3 months for as long as you are taking the medicine. Follow these instructions at home: Lifestyle  Do not use any products that contain nicotine or tobacco, such as cigarettes, e-cigarettes, and chewing tobacco. If you need help quitting, ask your health care provider.  Do not use street drugs.  Do not share needles.  Ask your health care provider for help if you need support or information about quitting drugs. Alcohol use  Do not drink alcohol if your health care provider tells you not to drink.  If you drink alcohol: ? Limit how much you have to 0-2 drinks a day. ? Be aware of how much alcohol is in your drink. In the [place] one drink equals one 12 oz bottle of beer (355 mL), one 5 oz glass of wine (148 mL), or one 1 oz glass of hard liquor (44 mL). General instructions  Schedule  regular health, dental, and eye exams.  Stay current with your vaccines.  Tell your health care provider if: ? You often feel depressed. ? You have ever been abused or do not feel safe at home. Summary  Adopting a healthy lifestyle and getting preventive care are important in promoting health and wellness.  Follow your health care provider's instructions about healthy diet, exercising, and getting tested or screened for diseases.  Follow your health care provider's instructions on monitoring your cholesterol and blood pressure. This information is not intended to replace advice given to you by your health care provider. Make sure you discuss any questions you have with your health care provider. Document Revised: 06/22/2018 Document Reviewed: 06/22/2018 Elsevier Patient Education  2020 Elsevier Inc.      Agustina Caroli, MD [facility]

## 2020-03-26 NOTE — Patient Instructions (Addendum)
   If you have lab work done today you will be contacted with your lab results within the next 2 weeks.  If you have not heard from us then please contact us. The fastest way to get your results is to register for My Chart.   IF you received an x-ray today, you will receive an invoice from [facility]. Please contact [facility] at [phone] with questions or concerns regarding your invoice.   IF you received labwork today, you will receive an invoice from [facility]. Please contact [facility] at [phone] with questions or concerns regarding your invoice.   Our billing staff will not be able to assist you with questions regarding bills from these companies.  You will be contacted with the lab results as soon as they are available. The fastest way to get your results is to activate your My Chart account. Instructions are located on the last page of this paperwork. If you have not heard from us regarding the results in 2 weeks, please contact this office.      Health Maintenance, Male Adopting a healthy lifestyle and getting preventive care are important in promoting health and wellness. Ask your health care provider about:  The right schedule for you to have regular tests and exams.  Things you can do on your own to prevent diseases and keep yourself healthy. What should I know about diet, weight, and exercise? Eat a healthy diet   Eat a diet that includes plenty of vegetables, fruits, low-fat dairy products, and lean protein.  Do not eat a lot of foods that are high in solid fats, added sugars, or sodium. Maintain a healthy weight Body mass index (BMI) is a measurement that can be used to identify possible weight problems. It estimates body fat based on height and weight. Your health care provider can help determine your BMI and help you achieve or maintain a healthy weight. Get regular exercise Get regular exercise. This is one of the most important things you  can do for your health. Most adults should:  Exercise for at least 150 minutes each week. The exercise should increase your heart rate and make you sweat (moderate-intensity exercise).  Do strengthening exercises at least twice a week. This is in addition to the moderate-intensity exercise.  Spend less time sitting. Even light physical activity can be beneficial. Watch cholesterol and blood lipids Have your blood tested for lipids and cholesterol at 54 years of age, then have this test every 5 years. You may need to have your cholesterol levels checked more often if:  Your lipid or cholesterol levels are high.  You are older than 52 years of age.  You are at high risk for heart disease. What should I know about cancer screening? Many types of cancers can be detected early and may often be prevented. Depending on your health history and family history, you may need to have cancer screening at various ages. This may include screening for:  Colorectal cancer.  Prostate cancer.  Skin cancer.  Lung cancer. What should I know about heart disease, diabetes, and high blood pressure? Blood pressure and heart disease  High blood pressure causes heart disease and increases the risk of stroke. This is more likely to develop in people who have high blood pressure readings, are of African descent, or are overweight.  Talk with your health care provider about your target blood pressure readings.  Have your blood pressure checked: ? Every 3-5 years if you are 18-39   years of age. ? Every year if you are 40 years old or older.  If you are between the ages of 65 and 75 and are a current or former smoker, ask your health care provider if you should have a one-time screening for abdominal aortic aneurysm (AAA). Diabetes Have regular diabetes screenings. This checks your fasting blood sugar level. Have the screening done:  Once every three years after age 45 if you are at a normal weight and have  a low risk for diabetes.  More often and at a younger age if you are overweight or have a high risk for diabetes. What should I know about preventing infection? Hepatitis B If you have a higher risk for hepatitis B, you should be screened for this virus. Talk with your health care provider to find out if you are at risk for hepatitis B infection. Hepatitis C Blood testing is recommended for:  Everyone born from 1945 through 1965.  Anyone with known risk factors for hepatitis C. Sexually transmitted infections (STIs)  You should be screened each year for STIs, including gonorrhea and chlamydia, if: ? You are sexually active and are younger than 52 years of age. ? You are older than 52 years of age and your health care provider tells you that you are at risk for this type of infection. ? Your sexual activity has changed since you were last screened, and you are at increased risk for chlamydia or gonorrhea. Ask your health care provider if you are at risk.  Ask your health care provider about whether you are at high risk for HIV. Your health care provider may recommend a prescription medicine to help prevent HIV infection. If you choose to take medicine to prevent HIV, you should first get tested for HIV. You should then be tested every 3 months for as long as you are taking the medicine. Follow these instructions at home: Lifestyle  Do not use any products that contain nicotine or tobacco, such as cigarettes, e-cigarettes, and chewing tobacco. If you need help quitting, ask your health care provider.  Do not use street drugs.  Do not share needles.  Ask your health care provider for help if you need support or information about quitting drugs. Alcohol use  Do not drink alcohol if your health care provider tells you not to drink.  If you drink alcohol: ? Limit how much you have to 0-2 drinks a day. ? Be aware of how much alcohol is in your drink. In the [place] one drink equals one 12  oz bottle of beer (355 mL), one 5 oz glass of wine (148 mL), or one 1 oz glass of hard liquor (44 mL). General instructions  Schedule regular health, dental, and eye exams.  Stay current with your vaccines.  Tell your health care provider if: ? You often feel depressed. ? You have ever been abused or do not feel safe at home. Summary  Adopting a healthy lifestyle and getting preventive care are important in promoting health and wellness.  Follow your health care provider's instructions about healthy diet, exercising, and getting tested or screened for diseases.  Follow your health care provider's instructions on monitoring your cholesterol and blood pressure. This information is not intended to replace advice given to you by your health care provider. Make sure you discuss any questions you have with your health care provider. Document Revised: 06/22/2018 Document Reviewed: 06/22/2018 Elsevier Patient Education  2020 Elsevier Inc.  

## 2020-03-27 LAB — COMPREHENSIVE METABOLIC PANEL
ALT: 19 IU/L (ref 0–44)
AST: 22 IU/L (ref 0–40)
Albumin/Globulin Ratio: 1.5 (ref 1.2–2.2)
Albumin: 4.7 g/dL (ref 3.8–4.9)
Alkaline Phosphatase: 80 IU/L (ref 44–121)
BUN/Creatinine Ratio: 13 (ref 9–20)
BUN: 14 mg/dL (ref 6–24)
Bilirubin Total: 0.5 mg/dL (ref 0.0–1.2)
CO2: 25 mmol/L (ref 20–29)
Calcium: 9.3 mg/dL (ref 8.7–10.2)
Chloride: 102 mmol/L (ref 96–106)
Creatinine, Ser: 1.04 mg/dL (ref 0.76–1.27)
GFR calc Af Amer: 95 mL/min/{1.73_m2} (ref >59)
GFR calc non Af Amer: 82 mL/min/{1.73_m2} (ref >59)
Globulin, Total: 3.2 g/dL (ref 1.5–4.5)
Glucose: 118 mg/dL — ABNORMAL HIGH (ref 65–99)
Potassium: 4.2 mmol/L (ref 3.5–5.2)
Sodium: 141 mmol/L (ref 134–144)
Total Protein: 7.9 g/dL (ref 6.0–8.5)

## 2020-03-27 LAB — LIPID PANEL
Chol/HDL Ratio: 3.8 ratio (ref 0.0–5.0)
Cholesterol, Total: 162 mg/dL (ref 100–199)
HDL: 43 mg/dL (ref >39)
LDL Chol Calc (NIH): 86 mg/dL (ref 0–99)
Triglycerides: 191 mg/dL — ABNORMAL HIGH (ref 0–149)
VLDL Cholesterol Cal: 33 mg/dL (ref 5–40)

## 2020-03-27 LAB — HEMOGLOBIN A1C
Est. average glucose Bld gHb Est-mCnc: 126 mg/dL
Hgb A1c MFr Bld: 6 % — ABNORMAL HIGH (ref 4.8–5.6)

## 2020-03-28 ENCOUNTER — Encounter: Payer: Self-pay | Admitting: Gastroenterology

## 2020-03-29 DIAGNOSIS — G4733 Obstructive sleep apnea (adult) (pediatric): Secondary | ICD-10-CM | POA: Diagnosis not present

## 2020-04-28 DIAGNOSIS — G4733 Obstructive sleep apnea (adult) (pediatric): Secondary | ICD-10-CM | POA: Diagnosis not present

## 2020-05-13 ENCOUNTER — Ambulatory Visit (AMBULATORY_SURGERY_CENTER): Payer: Self-pay

## 2020-05-13 ENCOUNTER — Other Ambulatory Visit: Payer: Self-pay

## 2020-05-13 VITALS — Ht 68.0 in | Wt 219.0 lb

## 2020-05-13 DIAGNOSIS — Z1211 Encounter for screening for malignant neoplasm of colon: Secondary | ICD-10-CM

## 2020-05-13 MED ORDER — PLENVU 140 G PO SOLR: 1.0000 | ORAL | 0 refills | Status: DC

## 2020-05-13 NOTE — Progress Notes (Signed)
No egg or soy allergy known to patient  No issues with past sedation with any surgeries or procedures---- no past surgeries No intubation problems in the past ----no past surgies No FH of Malignant Hyperthermia No diet pills per patient No home 02 use per patient  No blood thinners per patient  Pt denies issues with constipation  No A fib or A flutter  COVID 19 guidelines implemented in [facility] today with Pt and RN  Coupon given to pt in [facility] today , Code to Pharmacy  COVID vaccines completed on 02/2020 per pt;  Due to the COVID-19 pandemic we are asking patients to follow these guidelines. Please only bring one care partner. Please be aware that your care partner may wait in the car in the parking lot or if they feel like they will be too hot to wait in the car, they may wait in the lobby on the 4th floor. All care partners are required to wear a mask the entire time (we do not have any that we can provide them), they need to practice social distancing, and we will do a Covid check for all patient's and care partners when you arrive. Also we will check their temperature and your temperature. If the care partner waits in their car they need to stay in the parking lot the entire time and we will call them on their cell phone when the patient is ready for discharge so they can bring the car to the front of the building. Also all patient's will need to wear a mask into building.

## 2020-05-15 ENCOUNTER — Encounter: Payer: Self-pay | Admitting: Gastroenterology

## 2020-05-27 ENCOUNTER — Ambulatory Visit (AMBULATORY_SURGERY_CENTER): Payer: BC Managed Care – PPO | Admitting: Gastroenterology

## 2020-05-27 ENCOUNTER — Encounter: Payer: Self-pay | Admitting: Gastroenterology

## 2020-05-27 ENCOUNTER — Other Ambulatory Visit: Payer: Self-pay

## 2020-05-27 VITALS — BP 112/67 | HR 57 | Temp 97.7°F | Resp 20 | Ht 68.0 in | Wt 219.0 lb

## 2020-05-27 DIAGNOSIS — Z1211 Encounter for screening for malignant neoplasm of colon: Secondary | ICD-10-CM

## 2020-05-27 DIAGNOSIS — D123 Benign neoplasm of transverse colon: Secondary | ICD-10-CM | POA: Diagnosis not present

## 2020-05-27 MED ORDER — SODIUM CHLORIDE 0.9 % IV SOLN: 500.0000 mL | Freq: Once | INTRAVENOUS | Status: DC

## 2020-05-27 NOTE — Patient Instructions (Signed)
HANDOUTS PROVIDED ON: POLYPS & HEMORRHOIDS  The polyp removed today have been sent for pathology.  The results can take 1-3 weeks to receive.  When your next colonoscopy should occur will be based on the pathology results.    You may resume your previous diet and medication schedule.  Thank you for allowing [place] to care for you today!!!   YOU HAD AN ENDOSCOPIC PROCEDURE TODAY AT [facility]:   Refer to the procedure report that was given to you for any specific questions about what was found during the examination.  If the procedure report does not answer your questions, please call your gastroenterologist to clarify.  If you requested that your care partner not be given the details of your procedure findings, then the procedure report has been included in a sealed envelope for you to review at your convenience later.  YOU SHOULD EXPECT: Some feelings of bloating in the abdomen. Passage of more gas than usual.  Walking can help get rid of the air that was put into your GI tract during the procedure and reduce the bloating. If you had a lower endoscopy (such as a colonoscopy or flexible sigmoidoscopy) you may notice spotting of blood in your stool or on the toilet paper. If you underwent a bowel prep for your procedure, you may not have a normal bowel movement for a few days.  Please Note:  You might notice some irritation and congestion in your nose or some drainage.  This is from the oxygen used during your procedure.  There is no need for concern and it should clear up in a day or so.  SYMPTOMS TO REPORT IMMEDIATELY:   Following lower endoscopy (colonoscopy or flexible sigmoidoscopy):  Excessive amounts of blood in the stool  Significant tenderness or worsening of abdominal pains  Swelling of the abdomen that is new, acute  Fever of 100F or higher  For urgent or emergent issues, a gastroenterologist can be reached at any hour by calling [phone]. Do not use MyChart  messaging for urgent concerns.    DIET:  We do recommend a small meal at first, but then you may proceed to your regular diet.  Drink plenty of fluids but you should avoid alcoholic beverages for 24 hours.  ACTIVITY:  You should plan to take it easy for the rest of today and you should NOT DRIVE or use heavy machinery until tomorrow (because of the sedation medicines used during the test).    FOLLOW UP: Our staff will call the number listed on your records Wednesday morning between 7:15 am and 8:15 am to check on you and address any questions or concerns that you may have regarding the information given to you following your procedure. If we do not reach you, we will leave a message.  We will attempt to reach you two times.  During this call, we will ask if you have developed any symptoms of COVID 19. If you develop any symptoms (ie: fever, flu-like symptoms, shortness of breath, cough etc.) before then, please call [phone].  If you test positive for Covid 19 in the 2 weeks post procedure, please call and report this information to [place].    If any biopsies were taken you will be contacted by phone or by letter within the next 1-3 weeks.  Please call us at [phone] if you have not heard about the biopsies in 3 weeks.    SIGNATURES/CONFIDENTIALITY: You and/or your care partner have signed paperwork  which will be entered into your electronic medical record.  These signatures attest to the fact that that the information above on your After Visit Summary has been reviewed and is understood.  Full responsibility of the confidentiality of this discharge information lies with you and/or your care-partner.

## 2020-05-27 NOTE — Progress Notes (Signed)
Called to room to assist during endoscopic procedure.  Patient ID and intended procedure confirmed with present staff. Received instructions for my participation in the procedure from the performing physician.  

## 2020-05-27 NOTE — Op Note (Signed)
[facility] Patient Name: [name] Procedure Date: 05/27/2020 8:20 AM MRN: [id number] Endoscopist: Milus Banister , MD Age: 54 Referring MD:  Date of Birth: Dec 30, 1965 Gender: Male Account #: [id number] Procedure:                Colonoscopy Indications:              Screening for colorectal malignant neoplasm Medicines:                Monitored Anesthesia Care Procedure:                Pre-Anesthesia Assessment:                           - Prior to the procedure, a History and Physical                            was performed, and patient medications and                            allergies were reviewed. The patient's tolerance of                            previous anesthesia was also reviewed. The risks                            and benefits of the procedure and the sedation                            options and risks were discussed with the patient.                            All questions were answered, and informed consent                            was obtained. Prior Anticoagulants: The patient has                            taken no previous anticoagulant or antiplatelet                            agents. ASA Grade Assessment: II - A patient with                            mild systemic disease. After reviewing the risks                            and benefits, the patient was deemed in                            satisfactory condition to undergo the procedure.                           After obtaining informed consent, the colonoscope  was passed under direct vision. Throughout the                            procedure, the patient's blood pressure, pulse, and                            oxygen saturations were monitored continuously. The                            Colonoscope was introduced through the anus and                            advanced to the the cecum, identified by                            appendiceal orifice and  ileocecal valve. The                            colonoscopy was performed without difficulty. The                            patient tolerated the procedure well. The quality                            of the bowel preparation was good. The ileocecal                            valve, appendiceal orifice, and rectum were                            photographed. Scope In: 8:42:39 AM Scope Out: 4:49:67 AM Scope Withdrawal Time: 0 hours 11 minutes 19 seconds  Total Procedure Duration: 0 hours 14 minutes 8 seconds  Findings:                 An 8 mm polyp was found in the proximal transverse                            colon. The polyp was semi-pedunculated. The polyp                            was removed with a cold snare. Resection and                            retrieval were complete.                           The exam was otherwise without abnormality on                            direct and retroflexion views. Complications:            No immediate complications. Estimated blood loss:  None. Estimated Blood Loss:     Estimated blood loss: none. Impression:               - An 8 mm polyp in the proximal transverse colon,                            removed with a cold snare. Resected and retrieved.                           - The examination was otherwise normal on direct                            and retroflexion views. Recommendation:           - Patient has a contact number available for                            emergencies. The signs and symptoms of potential                            delayed complications were discussed with the                            patient. Return to normal activities tomorrow.                            Written discharge instructions were provided to the                            patient.                           - Resume previous diet.                           - Continue present medications.                           - Await  pathology results. Milus Banister, MD 05/27/2020 8:58:10 AM This report has been signed electronically.

## 2020-05-27 NOTE — Progress Notes (Signed)
Pt. Reports no change in his medical or surgical history since his pre-visit 05/13/2020.

## 2020-05-27 NOTE — Progress Notes (Signed)
To pacu, VSS. Report to [email or web address].tb °

## 2020-05-29 ENCOUNTER — Telehealth: Payer: Self-pay | Admitting: *Deleted

## 2020-05-29 DIAGNOSIS — G4733 Obstructive sleep apnea (adult) (pediatric): Secondary | ICD-10-CM | POA: Diagnosis not present

## 2020-05-29 NOTE — Telephone Encounter (Signed)
1. Have you developed a fever since your procedure? no  2.   Have you had an respiratory symptoms (SOB or cough) since your procedure? no  3.   Have you tested positive for COVID 19 since your procedure no  4.   Have you had any family members/close contacts diagnosed with the COVID 19 since your procedure?  no   If yes to any of these questions please route to Joylene John, RN and Joella Prince, RN Follow up Call-  Call back number 05/27/2020  Post procedure Call Back phone  # [phone]  Father's cell  - Reather Converse  Permission to leave phone message Yes  Some recent data might be hidden     Patient questions:  Do you have a fever, pain , or abdominal swelling? No. Pain Score  0 *  Have you tolerated food without any problems? Yes.    Have you been able to return to your normal activities? Yes.    Do you have any questions about your discharge instructions: Diet   No. Medications  No. Follow up visit  No.  Do you have questions or concerns about your Care? No.  Actions: * If pain score is 4 or above: No action needed, pain <4.

## 2020-05-29 NOTE — Telephone Encounter (Signed)
  Follow up Call-  Call back number 05/27/2020  Post procedure Call Back phone  # [phone]  Father's cell  - Reather Converse  Permission to leave phone message Yes  Some recent data might be hidden     Patient questions:  Message left to call is necessary.

## 2020-06-03 ENCOUNTER — Encounter: Payer: Self-pay | Admitting: Gastroenterology

## 2020-06-27 ENCOUNTER — Ambulatory Visit (INDEPENDENT_AMBULATORY_CARE_PROVIDER_SITE_OTHER): Payer: BC Managed Care – PPO | Admitting: Emergency Medicine

## 2020-06-27 ENCOUNTER — Other Ambulatory Visit: Payer: Self-pay

## 2020-06-27 ENCOUNTER — Encounter: Payer: Self-pay | Admitting: Emergency Medicine

## 2020-06-27 VITALS — BP 114/74 | HR 62 | Temp 98.2°F | Resp 16 | Ht 68.5 in | Wt 213.0 lb

## 2020-06-27 DIAGNOSIS — K219 Gastro-esophageal reflux disease without esophagitis: Secondary | ICD-10-CM | POA: Diagnosis not present

## 2020-06-27 DIAGNOSIS — L723 Sebaceous cyst: Secondary | ICD-10-CM

## 2020-06-27 DIAGNOSIS — E785 Hyperlipidemia, unspecified: Secondary | ICD-10-CM

## 2020-06-27 MED ORDER — PANTOPRAZOLE SODIUM 40 MG PO TBEC: 40.0000 mg | DELAYED_RELEASE_TABLET | Freq: Every day | ORAL | 1 refills | Status: DC

## 2020-06-27 MED ORDER — SIMVASTATIN 20 MG PO TABS: ORAL_TABLET | ORAL | 3 refills | Status: AC

## 2020-06-27 NOTE — Patient Instructions (Addendum)
If you have lab work done today you will be contacted with your lab results within the next 2 weeks.  If you have not heard from [place] then please contact us. The fastest way to get your results is to register for My Chart.   IF you received an x-ray today, you will receive an invoice from [facility]. Please contact [facility] at [phone] with questions or concerns regarding your invoice.   IF you received labwork today, you will receive an invoice from [facility]. Please contact [facility] at [phone] with questions or concerns regarding your invoice.   Our billing staff will not be able to assist you with questions regarding bills from these companies.  You will be contacted with the lab results as soon as they are available. The fastest way to get your results is to activate your My Chart account. Instructions are located on the last page of this paperwork. If you have not heard from [place] regarding the results in 2 weeks, please contact this office.     Epidermal Cyst  An epidermal cyst is a small, painless lump under your skin. The cyst contains a grayish-white, bad-smelling substance (keratin). Do not try to pop or open an epidermal cyst yourself. What are the causes?  A blocked hair follicle.  A hair that curls and re-enters the skin instead of growing straight out of the skin.  A blocked pore.  Irritated skin.  An injury to the skin.  Certain conditions that are passed along from parent to child (inherited).  Human papillomavirus (HPV).  Long-term sun damage to the skin. What increases the risk?  Having acne.  Being overweight.  Being 71-55 years old. What are the signs or symptoms? These cysts are usually harmless, but they can get infected. Symptoms of infection may include:  Redness.  Inflammation.  Tenderness.  Warmth.  Fever.  A grayish-white, bad-smelling substance drains from the cyst.  Pus drains from the cyst. How is  this treated? In many cases, epidermal cysts go away on their own without treatment. If a cyst becomes infected, treatment may include:  Opening and draining the cyst, done by a doctor. After draining, you may need minor surgery to remove the rest of the cyst.  Antibiotic medicine.  Shots of medicines (steroids) that help to reduce inflammation.  Surgery to remove the cyst. Surgery may be done if the cyst: ? Becomes large. ? Bothers you. ? Has a chance of turning into cancer.  Do not try to open a cyst yourself. Follow these instructions at home:  Take over-the-counter and prescription medicines only as told by your doctor.  If you were prescribed an antibiotic medicine, take it it as told by your doctor. Do not stop using the antibiotic even if you start to feel better.  Keep the area around your cyst clean and dry.  Wear loose, dry clothing.  Avoid touching your cyst.  Check your cyst every day for signs of infection. Check for: ? Redness, swelling, or pain. ? Fluid or blood. ? Warmth. ? Pus or a bad smell.  Keep all follow-up visits as told by your doctor. This is important. How is this prevented?  Wear clean, dry, clothing.  Avoid wearing tight clothing.  Keep your skin clean and dry. Take showers or baths every day. Contact a doctor if:  Your cyst has symptoms of infection.  Your condition does not improve or gets worse.  You have a cyst that looks different from  other cysts you have had.  You have a fever. Get help right away if:  Redness spreads from the cyst into the area close by. Summary  An epidermal cyst is a sac made of skin tissue.  If a cyst becomes infected, treatment may include surgery to open and drain the cyst, or to remove it.  Take over-the-counter and prescription medicines only as told by your doctor.  Contact a doctor if your condition is not improving or is getting worse.  Keep all follow-up visits as told by your doctor. This  is important. This information is not intended to replace advice given to you by your health care provider. Make sure you discuss any questions you have with your health care provider. Document Revised: 10/20/2018 Document Reviewed: 04/07/2018 Elsevier Patient Education  2020 [employer].

## 2020-06-27 NOTE — Progress Notes (Signed)
[name] 54 y.o.   Chief Complaint  Patient presents with  . Mass    Per patient bump on upper back for 10 years that drains with a smell    HISTORY OF PRESENT ILLNESS: This is a 54 y.o. male complaining of bump to upper back for the past 10 years.  Occasionally it drains foul smelling material. No other complaints or medical concerns today. Has history of dyslipidemia on simvastatin 20 mg daily. Also history of GERD on Protonix 40 mg as needed. Also history of sleep apnea on CPAP.  HPI   Prior to Admission medications   Medication Sig Start Date End Date Taking? Authorizing Provider  pantoprazole (PROTONIX) 40 MG tablet TAKE 1 TABLET BY MOUTH DAILY. FOR HEARTBURN AND REFLUX 11/22/18  Yes Horald Pollen, MD  simvastatin (ZOCOR) 20 MG tablet TAKE 1 TABLET BY MOUTH EVERY DAY EVERY EVENING 03/26/20  Yes Horald Pollen, MD    No Known Allergies  Patient Active Problem List   Diagnosis Date Noted  . OSA (obstructive sleep apnea) 06/27/2015  . Carpal tunnel syndrome, bilateral 04/29/2012    Past Medical History:  Diagnosis Date  . GERD (gastroesophageal reflux disease)     on meds  . High cholesterol   . Hyperlipidemia    on meds  . Sleep apnea    uses CPAP    Past Surgical History:  Procedure Laterality Date  . NO PAST SURGERIES      Social History   Socioeconomic History  . Marital status: Married    Spouse name: Not on file  . Number of children: Not on file  . Years of education: Not on file  . Highest education level: Not on file  Occupational History  . Not on file  Tobacco Use  . Smoking status: Former Smoker    Quit date: 11/30/2006    Years since quitting: 13.5  . Smokeless tobacco: Never Used  Vaping Use  . Vaping Use: Never used  Substance and Sexual Activity  . Alcohol use: Not Currently  . Drug use: No  . Sexual activity: Not on file  Other Topics Concern  . Not on file  Social History Narrative   ** Merged History  Encounter **       Social Determinants of Health   Financial Resource Strain: Not on file  Food Insecurity: Not on file  Transportation Needs: Not on file  Physical Activity: Not on file  Stress: Not on file  Social Connections: Not on file  Intimate Partner Violence: Not on file    Family History  Problem Relation Age of Onset  . Thyroid disease Mother   . Hyperlipidemia Mother   . Stroke Mother   . Hyperlipidemia Father   . Hypertension Father   . Stroke Father   . Colon polyps Neg Hx   . Colon cancer Neg Hx   . Esophageal cancer Neg Hx   . Rectal cancer Neg Hx   . Stomach cancer Neg Hx      Review of Systems  Constitutional: Negative.  Negative for chills and fever.  HENT: Negative.  Negative for congestion and sore throat.   Respiratory: Negative.  Negative for cough and shortness of breath.   Cardiovascular: Negative.  Negative for chest pain and palpitations.  Gastrointestinal: Negative.  Negative for abdominal pain, diarrhea, nausea and vomiting.  Genitourinary: Negative.  Negative for dysuria and hematuria.  Skin: Negative.  Negative for rash.  Neurological: Negative for dizziness and  headaches.  All other systems reviewed and are negative.   Today's Vitals   06/27/20 0955  BP: 114/74  Pulse: 62  Resp: 16  Temp: 98.2 F (36.8 C)  TempSrc: Temporal  SpO2: 97%  Weight: 213 lb (96.6 kg)  Height: 5' 8.5" (1.74 m)   Body mass index is 31.92 kg/m.  Physical Exam Vitals reviewed.  Constitutional:      Appearance: Normal appearance.  HENT:     Head: Normocephalic.  Eyes:     Extraocular Movements: Extraocular movements intact.     Pupils: Pupils are equal, round, and reactive to light.  Cardiovascular:     Rate and Rhythm: Normal rate and regular rhythm.     Pulses: Normal pulses.     Heart sounds: Normal heart sounds.  Pulmonary:     Effort: Pulmonary effort is normal.     Breath sounds: Normal breath sounds.  Musculoskeletal:     Cervical  back: Normal range of motion and neck supple.  Skin:    General: Skin is warm and dry.     Capillary Refill: Capillary refill takes less than 2 seconds.     Comments: Large sebaceous cyst to left upper back.  No erythema, swelling, or fluctuation.  No signs of infection and no drainage.  Neurological:     General: No focal deficit present.     Mental Status: [name] is alert and oriented to person, place, and time.  Psychiatric:        Mood and Affect: Mood normal.        Behavior: Behavior normal.      ASSESSMENT & PLAN: [name] was seen today for mass.  Diagnoses and all orders for this visit:  Sebaceous cyst Comments: Upper back Orders: -     Ambulatory referral to Dermatology  Dyslipidemia -     simvastatin (ZOCOR) 20 MG tablet; TAKE 1 TABLET BY MOUTH EVERY DAY EVERY EVENING  Gastroesophageal reflux disease -     pantoprazole (PROTONIX) 40 MG tablet; Take 1 tablet (40 mg total) by mouth daily.    Patient Instructions       If you have lab work done today you will be contacted with your lab results within the next 2 weeks.  If you have not heard from [place] then please contact us. The fastest way to get your results is to register for My Chart.   IF you received an x-ray today, you will receive an invoice from [facility]. Please contact [facility] at [phone] with questions or concerns regarding your invoice.   IF you received labwork today, you will receive an invoice from [facility]. Please contact [facility] at [phone] with questions or concerns regarding your invoice.   Our billing staff will not be able to assist you with questions regarding bills from these companies.  You will be contacted with the lab results as soon as they are available. The fastest way to get your results is to activate your My Chart account. Instructions are located on the last page of this paperwork. If you have not heard from [place] regarding the results in 2 weeks, please  contact this office.     Epidermal Cyst  An epidermal cyst is a small, painless lump under your skin. The cyst contains a grayish-white, bad-smelling substance (keratin). Do not try to pop or open an epidermal cyst yourself. What are the causes?  A blocked hair follicle.  A hair that curls and re-enters the skin instead of growing straight  out of the skin.  A blocked pore.  Irritated skin.  An injury to the skin.  Certain conditions that are passed along from parent to child (inherited).  Human papillomavirus (HPV).  Long-term sun damage to the skin. What increases the risk?  Having acne.  Being overweight.  Being 51-34 years old. What are the signs or symptoms? These cysts are usually harmless, but they can get infected. Symptoms of infection may include:  Redness.  Inflammation.  Tenderness.  Warmth.  Fever.  A grayish-white, bad-smelling substance drains from the cyst.  Pus drains from the cyst. How is this treated? In many cases, epidermal cysts go away on their own without treatment. If a cyst becomes infected, treatment may include:  Opening and draining the cyst, done by a doctor. After draining, you may need minor surgery to remove the rest of the cyst.  Antibiotic medicine.  Shots of medicines (steroids) that help to reduce inflammation.  Surgery to remove the cyst. Surgery may be done if the cyst: ? Becomes large. ? Bothers you. ? Has a chance of turning into cancer.  Do not try to open a cyst yourself. Follow these instructions at home:  Take over-the-counter and prescription medicines only as told by your doctor.  If you were prescribed an antibiotic medicine, take it it as told by your doctor. Do not stop using the antibiotic even if you start to feel better.  Keep the area around your cyst clean and dry.  Wear loose, dry clothing.  Avoid touching your cyst.  Check your cyst every day for signs of infection. Check for: ? Redness,  swelling, or pain. ? Fluid or blood. ? Warmth. ? Pus or a bad smell.  Keep all follow-up visits as told by your doctor. This is important. How is this prevented?  Wear clean, dry, clothing.  Avoid wearing tight clothing.  Keep your skin clean and dry. Take showers or baths every day. Contact a doctor if:  Your cyst has symptoms of infection.  Your condition does not improve or gets worse.  You have a cyst that looks different from other cysts you have had.  You have a fever. Get help right away if:  Redness spreads from the cyst into the area close by. Summary  An epidermal cyst is a sac made of skin tissue.  If a cyst becomes infected, treatment may include surgery to open and drain the cyst, or to remove it.  Take over-the-counter and prescription medicines only as told by your doctor.  Contact a doctor if your condition is not improving or is getting worse.  Keep all follow-up visits as told by your doctor. This is important. This information is not intended to replace advice given to you by your health care provider. Make sure you discuss any questions you have with your health care provider. Document Revised: 10/20/2018 Document Reviewed: 04/07/2018 Elsevier Patient Education  2020 [employer]      Craig Caroli, MD [facility]

## 2020-07-22 ENCOUNTER — Other Ambulatory Visit: Payer: Self-pay | Admitting: Emergency Medicine

## 2020-07-22 DIAGNOSIS — K219 Gastro-esophageal reflux disease without esophagitis: Secondary | ICD-10-CM

## 2020-07-29 DIAGNOSIS — G4733 Obstructive sleep apnea (adult) (pediatric): Secondary | ICD-10-CM | POA: Diagnosis not present

## 2020-08-05 ENCOUNTER — Other Ambulatory Visit: Payer: Self-pay | Admitting: Emergency Medicine

## 2020-08-05 DIAGNOSIS — K219 Gastro-esophageal reflux disease without esophagitis: Secondary | ICD-10-CM

## 2020-08-29 DIAGNOSIS — G4733 Obstructive sleep apnea (adult) (pediatric): Secondary | ICD-10-CM | POA: Diagnosis not present

## 2020-09-26 DIAGNOSIS — G4733 Obstructive sleep apnea (adult) (pediatric): Secondary | ICD-10-CM | POA: Diagnosis not present

## 2020-11-19 ENCOUNTER — Ambulatory Visit: Payer: BC Managed Care – PPO | Admitting: Family Medicine

## 2020-11-26 DIAGNOSIS — G4733 Obstructive sleep apnea (adult) (pediatric): Secondary | ICD-10-CM | POA: Diagnosis not present

## 2020-12-30 ENCOUNTER — Ambulatory Visit: Payer: BC Managed Care – PPO | Admitting: Emergency Medicine

## 2020-12-30 ENCOUNTER — Other Ambulatory Visit: Payer: Self-pay

## 2020-12-30 ENCOUNTER — Encounter: Payer: Self-pay | Admitting: Emergency Medicine

## 2020-12-30 VITALS — BP 100/80 | HR 61 | Temp 98.9°F | Ht 68.5 in | Wt 209.4 lb

## 2020-12-30 DIAGNOSIS — Z8669 Personal history of other diseases of the nervous system and sense organs: Secondary | ICD-10-CM | POA: Insufficient documentation

## 2020-12-30 DIAGNOSIS — G4733 Obstructive sleep apnea (adult) (pediatric): Secondary | ICD-10-CM | POA: Diagnosis not present

## 2020-12-30 DIAGNOSIS — Z8719 Personal history of other diseases of the digestive system: Secondary | ICD-10-CM

## 2020-12-30 DIAGNOSIS — E785 Hyperlipidemia, unspecified: Secondary | ICD-10-CM | POA: Diagnosis not present

## 2020-12-30 LAB — COMPREHENSIVE METABOLIC PANEL
ALT: 30 U/L (ref 0–53)
AST: 25 U/L (ref 0–37)
Albumin: 4.5 g/dL (ref 3.5–5.2)
Alkaline Phosphatase: 62 U/L (ref 39–117)
BUN: 12 mg/dL (ref 6–23)
CO2: 29 mEq/L (ref 19–32)
Calcium: 9.3 mg/dL (ref 8.4–10.5)
Chloride: 103 mEq/L (ref 96–112)
Creatinine, Ser: 0.99 mg/dL (ref 0.40–1.50)
GFR: 87.3 mL/min (ref >60.00)
Glucose, Bld: 82 mg/dL (ref 70–99)
Potassium: 4.5 mEq/L (ref 3.5–5.1)
Sodium: 139 mEq/L (ref 135–145)
Total Bilirubin: 0.5 mg/dL (ref 0.2–1.2)
Total Protein: 7.9 g/dL (ref 6.0–8.3)

## 2020-12-30 LAB — LIPID PANEL
Cholesterol: 174 mg/dL (ref 0–200)
HDL: 49.6 mg/dL (ref >39.00)
LDL Cholesterol: 104 mg/dL — ABNORMAL HIGH (ref 0–99)
NonHDL: 124.36
Total CHOL/HDL Ratio: 4
Triglycerides: 101 mg/dL (ref 0.0–149.0)
VLDL: 20.2 mg/dL (ref 0.0–40.0)

## 2020-12-30 LAB — HEMOGLOBIN A1C: Hgb A1c MFr Bld: 6.1 % (ref 4.6–6.5)

## 2020-12-30 NOTE — Assessment & Plan Note (Signed)
Well-controlled.  No concerns.

## 2020-12-30 NOTE — Progress Notes (Signed)
[name] 55 y.o.   Chief Complaint  Patient presents with   Follow-up    6 month chronic medical conditions    HISTORY OF PRESENT ILLNESS: This is a 55 y.o. male here for follow-up of chronic medical problems.  Here with brother who helps with translation. #1 dyslipidemia: On simvastatin 20 mg daily. #2 history of GERD, well controlled, occasionally takes Protonix 40 mg #3 history of sleep apnea #4 dermoid cyst to left upper back No other complaints or medical concerns today. Recent colonoscopy done on 05/27/2020 reviewed with patient.  8 mm polyp in the proximal transverse colon with benign pathology.  Otherwise unremarkable report.  HPI   Prior to Admission medications   Medication Sig Start Date End Date Taking? Authorizing Provider  pantoprazole (PROTONIX) 40 MG tablet TAKE 1 TABLET BY MOUTH EVERY DAY 08/05/20   Horald Pollen, MD  simvastatin (ZOCOR) 20 MG tablet TAKE 1 TABLET BY MOUTH EVERY DAY EVERY EVENING 06/27/20   Horald Pollen, MD    Not on File  Patient Active Problem List   Diagnosis Date Noted   Dyslipidemia 12/30/2020   OSA (obstructive sleep apnea) 06/27/2015   Carpal tunnel syndrome, bilateral 04/29/2012    Past Medical History:  Diagnosis Date   GERD (gastroesophageal reflux disease)     on meds   High cholesterol    Hyperlipidemia    on meds   Sleep apnea    uses CPAP    Past Surgical History:  Procedure Laterality Date   NO PAST SURGERIES      Social History   Socioeconomic History   Marital status: Married    Spouse name: Not on file   Number of children: Not on file   Years of education: Not on file   Highest education level: Not on file  Occupational History   Not on file  Tobacco Use   Smoking status: Former    Pack years: 0.00    Types: Cigarettes    Quit date: 11/30/2006    Years since quitting: 14.0   Smokeless tobacco: Never  Vaping Use   Vaping Use: Never used  Substance and Sexual Activity    Alcohol use: Not Currently   Drug use: No   Sexual activity: Not on file  Other Topics Concern   Not on file  Social History Narrative   ** Merged History Encounter **       Social Determinants of Health   Financial Resource Strain: Not on file  Food Insecurity: Not on file  Transportation Needs: Not on file  Physical Activity: Not on file  Stress: Not on file  Social Connections: Not on file  Intimate Partner Violence: Not on file    Family History  Problem Relation Age of Onset   Thyroid disease Mother    Hyperlipidemia Mother    Stroke Mother    Hyperlipidemia Father    Hypertension Father    Stroke Father    Colon polyps Neg Hx    Colon cancer Neg Hx    Esophageal cancer Neg Hx    Rectal cancer Neg Hx    Stomach cancer Neg Hx      Review of Systems  Constitutional: Negative.  Negative for chills and fever.  HENT: Negative.  Negative for congestion and sore throat.   Respiratory: Negative.  Negative for cough and shortness of breath.   Cardiovascular: Negative.  Negative for chest pain and palpitations.  Gastrointestinal:  Positive for heartburn (Occasional heartburn).  Negative for abdominal pain, blood in stool, diarrhea, melena, nausea and vomiting.  Genitourinary: Negative.  Negative for dysuria and hematuria.  Musculoskeletal: Negative.   Skin: Negative.        History of dermoid cyst left upper back, no infection  Neurological: Negative.  Negative for dizziness and headaches.  All other systems reviewed and are negative.  Today's Vitals   12/30/20 0920  BP: 100/80  Pulse: 61  Temp: 98.9 F (37.2 C)  TempSrc: Oral  SpO2: 98%  Weight: 209 lb 6.4 oz (95 kg)  Height: 5' 8.5" (1.74 m)   Body mass index is 31.38 kg/m. Wt Readings from Last 3 Encounters:  12/30/20 209 lb 6.4 oz (95 kg)  06/27/20 213 lb (96.6 kg)  05/27/20 219 lb (99.3 kg)    Physical Exam Vitals reviewed.  Constitutional:      Appearance: Normal appearance.  HENT:     Head:  Normocephalic.     Right Ear: Tympanic membrane, ear canal and external ear normal.     Left Ear: Tympanic membrane, ear canal and external ear normal.  Eyes:     Extraocular Movements: Extraocular movements intact.     Conjunctiva/sclera: Conjunctivae normal.     Pupils: Pupils are equal, round, and reactive to light.  Cardiovascular:     Rate and Rhythm: Normal rate and regular rhythm.     Pulses: Normal pulses.     Heart sounds: Normal heart sounds.  Pulmonary:     Effort: Pulmonary effort is normal.     Breath sounds: Normal breath sounds.  Abdominal:     General: Bowel sounds are normal. There is no distension.     Palpations: Abdomen is soft.     Tenderness: There is no abdominal tenderness.  Musculoskeletal:        General: Normal range of motion.     Cervical back: Normal range of motion and neck supple.  Skin:    General: Skin is warm and dry.  Neurological:     General: No focal deficit present.     Mental Status: He is alert and oriented to person, place, and time.  Psychiatric:        Mood and Affect: Mood normal.        Behavior: Behavior normal.     ASSESSMENT & PLAN: A total of 30 minutes was spent with the patient and counseling/coordination of care regarding chronic medical problems including dyslipidemia and GERD and cardiovascular risks associated with these conditions, review of all medications, need for blood work today, education on nutrition, health maintenance items including review of recent colonoscopy results and pathology report, review of most recent office visit notes, review of most recent blood work results, importance of diet and exercise in order to reduce chances of cardiovascular disease, prognosis, documentation and need for follow-up.  OSA (obstructive sleep apnea) Well-controlled.  No concerns.  Dyslipidemia Fasting lipid profile done today.  Continue simvastatin 20 mg daily. Diet and nutrition discussed.  History of gastroesophageal  reflux (GERD) Well-controlled.  Uses pantoprazole 40 mg only as needed.  Diet to reduce GERD symptoms discussed. [name] was seen today for follow-up and medication refill.  Diagnoses and all orders for this visit:  Dyslipidemia -     Lipid panel -     Hemoglobin A1c -     Comprehensive metabolic panel  History of gastroesophageal reflux (GERD)  History of sleep apnea  OSA (obstructive sleep apnea)  Patient Instructions  Please.  Health Maintenance, Male Adopting  a healthy lifestyle and getting preventive care are important in promoting health and wellness. Ask your health care provider about: The right schedule for you to have regular tests and exams. Things you can do on your own to prevent diseases and keep yourself healthy. What should I know about diet, weight, and exercise? Eat a healthy diet  Eat a diet that includes plenty of vegetables, fruits, low-fat dairy products, and lean protein. Do not eat a lot of foods that are high in solid fats, added sugars, or sodium.  Maintain a healthy weight Body mass index (BMI) is a measurement that can be used to identify possible weight problems. It estimates body fat based on height and weight. Your health care provider can help determine your BMI and help you achieve or maintain ahealthy weight. Get regular exercise Get regular exercise. This is one of the most important things you can do for your health. Most adults should: Exercise for at least 150 minutes each week. The exercise should increase your heart rate and make you sweat (moderate-intensity exercise). Do strengthening exercises at least twice a week. This is in addition to the moderate-intensity exercise. Spend less time sitting. Even light physical activity can be beneficial. Watch cholesterol and blood lipids Have your blood tested for lipids and cholesterol at 55 years of age, then havethis test every 5 years. You may need to have your cholesterol levels checked more  often if: Your lipid or cholesterol levels are high. You are older than 53 years of age. You are at high risk for heart disease. What should I know about cancer screening? Many types of cancers can be detected early and may often be prevented. Depending on your health history and family history, you may need to have cancer screening at various ages. This may include screening for: Colorectal cancer. Prostate cancer. Skin cancer. Lung cancer. What should I know about heart disease, diabetes, and high blood pressure? Blood pressure and heart disease High blood pressure causes heart disease and increases the risk of stroke. This is more likely to develop in people who have high blood pressure readings, are of African descent, or are overweight. Talk with your health care provider about your target blood pressure readings. Have your blood pressure checked: Every 3-5 years if you are 49-19 years of age. Every year if you are 7 years old or older. If you are between the ages of 77 and 56 and are a current or former smoker, ask your health care provider if you should have a one-time screening for abdominal aortic aneurysm (AAA). Diabetes Have regular diabetes screenings. This checks your fasting blood sugar level. Have the screening done: Once every three years after age 28 if you are at a normal weight and have a low risk for diabetes. More often and at a younger age if you are overweight or have a high risk for diabetes. What should I know about preventing infection? Hepatitis B If you have a higher risk for hepatitis B, you should be screened for this virus. Talk with your health care provider to find out if you are at risk forhepatitis B infection. Hepatitis C Blood testing is recommended for: Everyone born from [birth year] through 1963. Anyone with known risk factors for hepatitis C. Sexually transmitted infections (STIs) You should be screened each year for STIs, including gonorrhea and  chlamydia, if: You are sexually active and are younger than 53 years of age. You are older than 53 years of age and your health  care provider tells you that you are at risk for this type of infection. Your sexual activity has changed since you were last screened, and you are at increased risk for chlamydia or gonorrhea. Ask your health care provider if you are at risk. Ask your health care provider about whether you are at high risk for HIV. Your health care provider may recommend a prescription medicine to help prevent HIV infection. If you choose to take medicine to prevent HIV, you should first get tested for HIV. You should then be tested every 3 months for as long as you are taking the medicine. Follow these instructions at home: Lifestyle Do not use any products that contain nicotine or tobacco, such as cigarettes, e-cigarettes, and chewing tobacco. If you need help quitting, ask your health care provider. Do not use street drugs. Do not share needles. Ask your health care provider for help if you need support or information about quitting drugs. Alcohol use Do not drink alcohol if your health care provider tells you not to drink. If you drink alcohol: Limit how much you have to 0-2 drinks a day. Be aware of how much alcohol is in your drink. In the [place] one drink equals one 12 oz bottle of beer (355 mL), one 5 oz glass of wine (148 mL), or one 1 oz glass of hard liquor (44 mL). General instructions Schedule regular health, dental, and eye exams. Stay current with your vaccines. Tell your health care provider if: You often feel depressed. You have ever been abused or do not feel safe at home. Summary Adopting a healthy lifestyle and getting preventive care are important in promoting health and wellness. Follow your health care provider's instructions about healthy diet, exercising, and getting tested or screened for diseases. Follow your health care provider's instructions on  monitoring your cholesterol and blood pressure. This information is not intended to replace advice given to you by your health care provider. Make sure you discuss any questions you have with your healthcare provider. Document Revised: 06/22/2018 Document Reviewed: 06/22/2018 Elsevier Patient Education  2022 [employer] [facility]

## 2020-12-30 NOTE — Assessment & Plan Note (Signed)
Fasting lipid profile done today.  Continue simvastatin 20 mg daily. Diet and nutrition discussed.

## 2020-12-30 NOTE — Patient Instructions (Signed)
Please.  Health Maintenance, Male Adopting a healthy lifestyle and getting preventive care are important in promoting health and wellness. Ask your health care provider about: The right schedule for you to have regular tests and exams. Things you can do on your own to prevent diseases and keep yourself healthy. What should I know about diet, weight, and exercise? Eat a healthy diet  Eat a diet that includes plenty of vegetables, fruits, low-fat dairy products, and lean protein. Do not eat a lot of foods that are high in solid fats, added sugars, or sodium.  Maintain a healthy weight Body mass index (BMI) is a measurement that can be used to identify possible weight problems. It estimates body fat based on height and weight. Your health care provider can help determine your BMI and help you achieve or maintain ahealthy weight. Get regular exercise Get regular exercise. This is one of the most important things you can do for your health. Most adults should: Exercise for at least 150 minutes each week. The exercise should increase your heart rate and make you sweat (moderate-intensity exercise). Do strengthening exercises at least twice a week. This is in addition to the moderate-intensity exercise. Spend less time sitting. Even light physical activity can be beneficial. Watch cholesterol and blood lipids Have your blood tested for lipids and cholesterol at 55 years of age, then havethis test every 5 years. You may need to have your cholesterol levels checked more often if: Your lipid or cholesterol levels are high. You are older than 53 years of age. You are at high risk for heart disease. What should I know about cancer screening? Many types of cancers can be detected early and may often be prevented. Depending on your health history and family history, you may need to have cancer screening at various ages. This may include screening for: Colorectal cancer. Prostate cancer. Skin  cancer. Lung cancer. What should I know about heart disease, diabetes, and high blood pressure? Blood pressure and heart disease High blood pressure causes heart disease and increases the risk of stroke. This is more likely to develop in people who have high blood pressure readings, are of African descent, or are overweight. Talk with your health care provider about your target blood pressure readings. Have your blood pressure checked: Every 3-5 years if you are 14-53 years of age. Every year if you are 53 years old or older. If you are between the ages of 53 and 43 and are a current or former smoker, ask your health care provider if you should have a one-time screening for abdominal aortic aneurysm (AAA). Diabetes Have regular diabetes screenings. This checks your fasting blood sugar level. Have the screening done: Once every three years after age 56 if you are at a normal weight and have a low risk for diabetes. More often and at a younger age if you are overweight or have a high risk for diabetes. What should I know about preventing infection? Hepatitis B If you have a higher risk for hepatitis B, you should be screened for this virus. Talk with your health care provider to find out if you are at risk forhepatitis B infection. Hepatitis C Blood testing is recommended for: Everyone born from [birth year] through 1965. Anyone with known risk factors for hepatitis C. Sexually transmitted infections (STIs) You should be screened each year for STIs, including gonorrhea and chlamydia, if: You are sexually active and are younger than 53 years of age. You are older than 53 years  of age and your health care provider tells you that you are at risk for this type of infection. Your sexual activity has changed since you were last screened, and you are at increased risk for chlamydia or gonorrhea. Ask your health care provider if you are at risk. Ask your health care provider about whether you are at high  risk for HIV. Your health care provider may recommend a prescription medicine to help prevent HIV infection. If you choose to take medicine to prevent HIV, you should first get tested for HIV. You should then be tested every 3 months for as long as you are taking the medicine. Follow these instructions at home: Lifestyle Do not use any products that contain nicotine or tobacco, such as cigarettes, e-cigarettes, and chewing tobacco. If you need help quitting, ask your health care provider. Do not use street drugs. Do not share needles. Ask your health care provider for help if you need support or information about quitting drugs. Alcohol use Do not drink alcohol if your health care provider tells you not to drink. If you drink alcohol: Limit how much you have to 0-2 drinks a day. Be aware of how much alcohol is in your drink. In the [place] one drink equals one 12 oz bottle of beer (355 mL), one 5 oz glass of wine (148 mL), or one 1 oz glass of hard liquor (44 mL). General instructions Schedule regular health, dental, and eye exams. Stay current with your vaccines. Tell your health care provider if: You often feel depressed. You have ever been abused or do not feel safe at home. Summary Adopting a healthy lifestyle and getting preventive care are important in promoting health and wellness. Follow your health care provider's instructions about healthy diet, exercising, and getting tested or screened for diseases. Follow your health care provider's instructions on monitoring your cholesterol and blood pressure. This information is not intended to replace advice given to you by your health care provider. Make sure you discuss any questions you have with your healthcare provider. Document Revised: 06/22/2018 Document Reviewed: 06/22/2018 Elsevier Patient Education  2022 [employer].

## 2020-12-30 NOTE — Assessment & Plan Note (Signed)
Well-controlled.  Uses pantoprazole 40 mg only as needed.  Diet to reduce GERD symptoms discussed.

## 2021-03-27 ENCOUNTER — Encounter: Payer: Self-pay | Admitting: Emergency Medicine

## 2021-04-09 ENCOUNTER — Ambulatory Visit (INDEPENDENT_AMBULATORY_CARE_PROVIDER_SITE_OTHER): Payer: BC Managed Care – PPO | Admitting: Emergency Medicine

## 2021-04-09 ENCOUNTER — Other Ambulatory Visit: Payer: Self-pay

## 2021-04-09 ENCOUNTER — Encounter: Payer: Self-pay | Admitting: Emergency Medicine

## 2021-04-09 VITALS — BP 126/78 | HR 60 | Temp 97.9°F | Ht 68.0 in | Wt 210.0 lb

## 2021-04-09 DIAGNOSIS — Z0000 Encounter for general adult medical examination without abnormal findings: Secondary | ICD-10-CM

## 2021-04-09 DIAGNOSIS — Z8719 Personal history of other diseases of the digestive system: Secondary | ICD-10-CM | POA: Diagnosis not present

## 2021-04-09 DIAGNOSIS — Z125 Encounter for screening for malignant neoplasm of prostate: Secondary | ICD-10-CM | POA: Diagnosis not present

## 2021-04-09 DIAGNOSIS — Z23 Encounter for immunization: Secondary | ICD-10-CM

## 2021-04-09 DIAGNOSIS — Z1159 Encounter for screening for other viral diseases: Secondary | ICD-10-CM

## 2021-04-09 DIAGNOSIS — E785 Hyperlipidemia, unspecified: Secondary | ICD-10-CM

## 2021-04-09 DIAGNOSIS — Z8669 Personal history of other diseases of the nervous system and sense organs: Secondary | ICD-10-CM

## 2021-04-09 LAB — PSA: PSA: 0.44 ng/mL (ref 0.10–4.00)

## 2021-04-09 NOTE — Patient Instructions (Signed)
Health Maintenance, Male Adopting a healthy lifestyle and getting preventive care are important in promoting health and wellness. Ask your health care provider about: The right schedule for you to have regular tests and exams. Things you can do on your own to prevent diseases and keep yourself healthy. What should I know about diet, weight, and exercise? Eat a healthy diet  Eat a diet that includes plenty of vegetables, fruits, low-fat dairy products, and lean protein. Do not eat a lot of foods that are high in solid fats, added sugars, or sodium. Maintain a healthy weight Body mass index (BMI) is a measurement that can be used to identify possible weight problems. It estimates body fat based on height and weight. Your health care provider can help determine your BMI and help you achieve or maintain a healthy weight. Get regular exercise Get regular exercise. This is one of the most important things you can do for your health. Most adults should: Exercise for at least 150 minutes each week. The exercise should increase your heart rate and make you sweat (moderate-intensity exercise). Do strengthening exercises at least twice a week. This is in addition to the moderate-intensity exercise. Spend less time sitting. Even light physical activity can be beneficial. Watch cholesterol and blood lipids Have your blood tested for lipids and cholesterol at 55 years of age, then have this test every 5 years. You may need to have your cholesterol levels checked more often if: Your lipid or cholesterol levels are high. You are older than 53 years of age. You are at high risk for heart disease. What should I know about cancer screening? Many types of cancers can be detected early and may often be prevented. Depending on your health history and family history, you may need to have cancer screening at various ages. This may include screening for: Colorectal cancer. Prostate cancer. Skin cancer. Lung  cancer. What should I know about heart disease, diabetes, and high blood pressure? Blood pressure and heart disease High blood pressure causes heart disease and increases the risk of stroke. This is more likely to develop in people who have high blood pressure readings, are of African descent, or are overweight. Talk with your health care provider about your target blood pressure readings. Have your blood pressure checked: Every 3-5 years if you are 18-39 years of age. Every year if you are 40 years old or older. If you are between the ages of 65 and 75 and are a current or former smoker, ask your health care provider if you should have a one-time screening for abdominal aortic aneurysm (AAA). Diabetes Have regular diabetes screenings. This checks your fasting blood sugar level. Have the screening done: Once every three years after age 45 if you are at a normal weight and have a low risk for diabetes. More often and at a younger age if you are overweight or have a high risk for diabetes. What should I know about preventing infection? Hepatitis B If you have a higher risk for hepatitis B, you should be screened for this virus. Talk with your health care provider to find out if you are at risk for hepatitis B infection. Hepatitis C Blood testing is recommended for: Everyone born from 1945 through 1965. Anyone with known risk factors for hepatitis C. Sexually transmitted infections (STIs) You should be screened each year for STIs, including gonorrhea and chlamydia, if: You are sexually active and are younger than 53 years of age. You are older than 53 years   of age and your health care provider tells you that you are at risk for this type of infection. Your sexual activity has changed since you were last screened, and you are at increased risk for chlamydia or gonorrhea. Ask your health care provider if you are at risk. Ask your health care provider about whether you are at high risk for HIV.  Your health care provider may recommend a prescription medicine to help prevent HIV infection. If you choose to take medicine to prevent HIV, you should first get tested for HIV. You should then be tested every 3 months for as long as you are taking the medicine. Follow these instructions at home: Lifestyle Do not use any products that contain nicotine or tobacco, such as cigarettes, e-cigarettes, and chewing tobacco. If you need help quitting, ask your health care provider. Do not use street drugs. Do not share needles. Ask your health care provider for help if you need support or information about quitting drugs. Alcohol use Do not drink alcohol if your health care provider tells you not to drink. If you drink alcohol: Limit how much you have to 0-2 drinks a day. Be aware of how much alcohol is in your drink. In the [place] one drink equals one 12 oz bottle of beer (355 mL), one 5 oz glass of wine (148 mL), or one 1 oz glass of hard liquor (44 mL). General instructions Schedule regular health, dental, and eye exams. Stay current with your vaccines. Tell your health care provider if: You often feel depressed. You have ever been abused or do not feel safe at home. Summary Adopting a healthy lifestyle and getting preventive care are important in promoting health and wellness. Follow your health care provider's instructions about healthy diet, exercising, and getting tested or screened for diseases. Follow your health care provider's instructions on monitoring your cholesterol and blood pressure. This information is not intended to replace advice given to you by your health care provider. Make sure you discuss any questions you have with your health care provider. Document Revised: 09/06/2020 Document Reviewed: 06/22/2018 Elsevier Patient Education  2022 [employer]  

## 2021-04-09 NOTE — Progress Notes (Signed)
[name] 55 y.o.   Chief Complaint  Patient presents with   Annual Exam    HISTORY OF PRESENT ILLNESS: This is a 55 y.o. male here for annual exam. Has the following chronic medical problems.  Here with [employer] employee who helps with translation. #1 dyslipidemia: On simvastatin 20 mg daily. #2 history of GERD, well controlled, occasionally takes Protonix 40 mg #3 history of sleep apnea #4 dermoid cyst to left upper back No other complaints or medical concerns today. Recent colonoscopy done on 05/27/2020 reviewed with patient.  8 mm polyp in the proximal transverse colon with benign pathology.  Otherwise unremarkable report. Most recent blood work results reviewed with patient.  Normal CMP, A1c, and lipid profile. Work requesting PSA as well.  HPI   Prior to Admission medications   Medication Sig Start Date End Date Taking? Authorizing Provider  simvastatin (ZOCOR) 20 MG tablet TAKE 1 TABLET BY MOUTH EVERY DAY EVERY EVENING 06/27/20  Yes Chyenne Sobczak, Ines Bloomer, MD  pantoprazole ([place]) 40 MG tablet TAKE 1 TABLET BY MOUTH EVERY DAY Patient not taking: No sig reported 08/05/20   Horald Pollen, MD    Not on File  Patient Active Problem List   Diagnosis Date Noted   Dyslipidemia 12/30/2020   History of gastroesophageal reflux (GERD) 12/30/2020   History of sleep apnea 12/30/2020   OSA (obstructive sleep apnea) 06/27/2015   Carpal tunnel syndrome, bilateral 04/29/2012    Past Medical History:  Diagnosis Date   GERD (gastroesophageal reflux disease)     on meds   High cholesterol    Hyperlipidemia    on meds   Sleep apnea    uses CPAP    Past Surgical History:  Procedure Laterality Date   NO PAST SURGERIES      Social History   Socioeconomic History   Marital status: Married    Spouse name: Not on file   Number of children: Not on file   Years of education: Not on file   Highest education level: Not on file  Occupational History   Not on file   Tobacco Use   Smoking status: Former    Types: Cigarettes    Quit date: 11/30/2006    Years since quitting: 14.3   Smokeless tobacco: Never  Vaping Use   Vaping Use: Never used  Substance and Sexual Activity   Alcohol use: Not Currently   Drug use: No   Sexual activity: Not on file  Other Topics Concern   Not on file  Social History Narrative   ** Merged History Encounter **       Social Determinants of Health   Financial Resource Strain: Not on file  Food Insecurity: Not on file  Transportation Needs: Not on file  Physical Activity: Not on file  Stress: Not on file  Social Connections: Not on file  Intimate Partner Violence: Not on file    Family History  Problem Relation Age of Onset   Thyroid disease Mother    Hyperlipidemia Mother    Stroke Mother    Hyperlipidemia Father    Hypertension Father    Stroke Father    Colon polyps Neg Hx    Colon cancer Neg Hx    Esophageal cancer Neg Hx    Rectal cancer Neg Hx    Stomach cancer Neg Hx      Review of Systems  Constitutional: Negative.  Negative for chills and fever.  HENT: Negative.  Negative for congestion and sore  throat.   Respiratory: Negative.  Negative for cough and shortness of breath.   Cardiovascular:  Negative for chest pain and palpitations.  Gastrointestinal:  Negative for abdominal pain, diarrhea, nausea and vomiting.  Genitourinary: Negative.   Skin: Negative.  Negative for rash.  Neurological: Negative.  Negative for dizziness and headaches.  All other systems reviewed and are negative.  Today's Vitals   04/09/21 0853  BP: 126/78  Pulse: 60  Temp: 97.9 F (36.6 C)  TempSrc: Oral  SpO2: 95%  Weight: 210 lb (95.3 kg)  Height: 5\' 8"  (1.727 m)   Body mass index is 31.93 kg/m. Wt Readings from Last 3 Encounters:  04/09/21 210 lb (95.3 kg)  12/30/20 209 lb 6.4 oz (95 kg)  06/27/20 213 lb (96.6 kg)    Physical Exam Vitals reviewed.  Constitutional:      Appearance: Normal  appearance.  HENT:     Head: Normocephalic.     Right Ear: Tympanic membrane, ear canal and external ear normal.     Left Ear: Tympanic membrane, ear canal and external ear normal.     Mouth/Throat:     Mouth: Mucous membranes are moist.     Pharynx: Oropharynx is clear.  Eyes:     Extraocular Movements: Extraocular movements intact.     Conjunctiva/sclera: Conjunctivae normal.     Pupils: Pupils are equal, round, and reactive to light.  Cardiovascular:     Rate and Rhythm: Normal rate and regular rhythm.     Pulses: Normal pulses.     Heart sounds: Normal heart sounds.  Pulmonary:     Effort: Pulmonary effort is normal.     Breath sounds: Normal breath sounds.  Abdominal:     General: Bowel sounds are normal. There is no distension.     Palpations: Abdomen is soft. There is no mass.     Tenderness: There is no abdominal tenderness.  Musculoskeletal:        General: Normal range of motion.     Cervical back: Normal range of motion and neck supple. No tenderness.     Right lower leg: No edema.     Left lower leg: No edema.  Lymphadenopathy:     Cervical: No cervical adenopathy.  Skin:    General: Skin is warm and dry.     Capillary Refill: Capillary refill takes less than 2 seconds.  Neurological:     General: No focal deficit present.     Mental Status: He is alert and oriented to person, place, and time.  Psychiatric:        Mood and Affect: Mood normal.        Behavior: Behavior normal.     ASSESSMENT & PLAN: Elisha was seen today for annual exam.  Diagnoses and all orders for this visit:  Routine general medical examination at a health care facility  Need for hepatitis C screening test -     Hepatitis C antibody  Prostate cancer screening -     PSA  History of gastroesophageal reflux (GERD)  History of sleep apnea  Dyslipidemia  Need for shingles vaccine -     Varicella-zoster vaccine IM (Shingrix)   Modifiable risk factors discussed with  patient. Anticipatory guidance according to age provided. The following topics were also discussed: Social Determinants of Health Smoking.  Non-smoker Diet and nutrition and need to decrease amount of carbohydrate daily intake Benefits of exercise Cancer screening and review of most recent colonoscopy report. Vaccinations recommendations Cardiovascular risk assessment  The 10-year ASCVD risk score (Arnett DK, et al., 2019) is: 3.8%   Values used to calculate the score:     Age: 59 years     Sex: Male     Is Non-Hispanic African American: No     Diabetic: No     Tobacco smoker: No     Systolic Blood Pressure: 161 mmHg     Is BP treated: No     HDL Cholesterol: 49.6 mg/dL     Total Cholesterol: 174 mg/dL  Mental health including depression and anxiety Fall and accident prevention  Patient Instructions  Health Maintenance, Male Adopting a healthy lifestyle and getting preventive care are important in promoting health and wellness. Ask your health care provider about: The right schedule for you to have regular tests and exams. Things you can do on your own to prevent diseases and keep yourself healthy. What should I know about diet, weight, and exercise? Eat a healthy diet  Eat a diet that includes plenty of vegetables, fruits, low-fat dairy products, and lean protein. Do not eat a lot of foods that are high in solid fats, added sugars, or sodium. Maintain a healthy weight Body mass index (BMI) is a measurement that can be used to identify possible weight problems. It estimates body fat based on height and weight. Your health care provider can help determine your BMI and help you achieve or maintain a healthy weight. Get regular exercise Get regular exercise. This is one of the most important things you can do for your health. Most adults should: Exercise for at least 150 minutes each week. The exercise should increase your heart rate and make you sweat (moderate-intensity  exercise). Do strengthening exercises at least twice a week. This is in addition to the moderate-intensity exercise. Spend less time sitting. Even light physical activity can be beneficial. Watch cholesterol and blood lipids Have your blood tested for lipids and cholesterol at 53 years of age, then have this test every 5 years. You may need to have your cholesterol levels checked more often if: Your lipid or cholesterol levels are high. You are older than 53 years of age. You are at high risk for heart disease. What should I know about cancer screening? Many types of cancers can be detected early and may often be prevented. Depending on your health history and family history, you may need to have cancer screening at various ages. This may include screening for: Colorectal cancer. Prostate cancer. Skin cancer. Lung cancer. What should I know about heart disease, diabetes, and high blood pressure? Blood pressure and heart disease High blood pressure causes heart disease and increases the risk of stroke. This is more likely to develop in people who have high blood pressure readings, are of African descent, or are overweight. Talk with your health care provider about your target blood pressure readings. Have your blood pressure checked: Every 3-5 years if you are 15-46 years of age. Every year if you are 77 years old or older. If you are between the ages of 31 and 22 and are a current or former smoker, ask your health care provider if you should have a one-time screening for abdominal aortic aneurysm (AAA). Diabetes Have regular diabetes screenings. This checks your fasting blood sugar level. Have the screening done: Once every three years after age 72 if you are at a normal weight and have a low risk for diabetes. More often and at a younger age if you are overweight or have a  high risk for diabetes. What should I know about preventing infection? Hepatitis B If you have a higher risk for  hepatitis B, you should be screened for this virus. Talk with your health care provider to find out if you are at risk for hepatitis B infection. Hepatitis C Blood testing is recommended for: Everyone born from [birth year] through 1963. Anyone with known risk factors for hepatitis C. Sexually transmitted infections (STIs) You should be screened each year for STIs, including gonorrhea and chlamydia, if: You are sexually active and are younger than 53 years of age. You are older than 53 years of age and your health care provider tells you that you are at risk for this type of infection. Your sexual activity has changed since you were last screened, and you are at increased risk for chlamydia or gonorrhea. Ask your health care provider if you are at risk. Ask your health care provider about whether you are at high risk for HIV. Your health care provider may recommend a prescription medicine to help prevent HIV infection. If you choose to take medicine to prevent HIV, you should first get tested for HIV. You should then be tested every 3 months for as long as you are taking the medicine. Follow these instructions at home: Lifestyle Do not use any products that contain nicotine or tobacco, such as cigarettes, e-cigarettes, and chewing tobacco. If you need help quitting, ask your health care provider. Do not use street drugs. Do not share needles. Ask your health care provider for help if you need support or information about quitting drugs. Alcohol use Do not drink alcohol if your health care provider tells you not to drink. If you drink alcohol: Limit how much you have to 0-2 drinks a day. Be aware of how much alcohol is in your drink. In the [place] one drink equals one 12 oz bottle of beer (355 mL), one 5 oz glass of wine (148 mL), or one 1 oz glass of hard liquor (44 mL). General instructions Schedule regular health, dental, and eye exams. Stay current with your vaccines. Tell your health care  provider if: You often feel depressed. You have ever been abused or do not feel safe at home. Summary Adopting a healthy lifestyle and getting preventive care are important in promoting health and wellness. Follow your health care provider's instructions about healthy diet, exercising, and getting tested or screened for diseases. Follow your health care provider's instructions on monitoring your cholesterol and blood pressure. This information is not intended to replace advice given to you by your health care provider. Make sure you discuss any questions you have with your health care provider. Document Revised: 09/06/2020 Document Reviewed: 06/22/2018 Elsevier Patient Education  2022 [place], MD [facility]

## 2021-04-10 LAB — HEPATITIS C ANTIBODY
Hepatitis C Ab: NONREACTIVE
SIGNAL TO CUT-OFF: 0.02 (ref <1.00)

## 2021-06-11 ENCOUNTER — Telehealth: Payer: Self-pay | Admitting: Emergency Medicine

## 2021-06-11 ENCOUNTER — Emergency Department (HOSPITAL_COMMUNITY)
Admission: EM | Admit: 2021-06-11 | Discharge: 2021-06-11 | Disposition: A | Discharge status: Home / Self Care | Payer: BC Managed Care – PPO | Attending: Emergency Medicine | Admitting: Emergency Medicine

## 2021-06-11 ENCOUNTER — Encounter (HOSPITAL_COMMUNITY): Payer: Self-pay

## 2021-06-11 ENCOUNTER — Other Ambulatory Visit: Payer: Self-pay

## 2021-06-11 ENCOUNTER — Emergency Department (HOSPITAL_COMMUNITY): Payer: BC Managed Care – PPO

## 2021-06-11 DIAGNOSIS — R0789 Other chest pain: Secondary | ICD-10-CM | POA: Insufficient documentation

## 2021-06-11 DIAGNOSIS — K219 Gastro-esophageal reflux disease without esophagitis: Secondary | ICD-10-CM | POA: Diagnosis not present

## 2021-06-11 DIAGNOSIS — Z87891 Personal history of nicotine dependence: Secondary | ICD-10-CM | POA: Insufficient documentation

## 2021-06-11 DIAGNOSIS — M25519 Pain in unspecified shoulder: Secondary | ICD-10-CM | POA: Insufficient documentation

## 2021-06-11 DIAGNOSIS — M542 Cervicalgia: Secondary | ICD-10-CM | POA: Diagnosis not present

## 2021-06-11 DIAGNOSIS — R079 Chest pain, unspecified: Secondary | ICD-10-CM | POA: Diagnosis not present

## 2021-06-11 LAB — CBC
HCT: 42.3 % (ref 39.0–52.0)
Hemoglobin: 13.7 g/dL (ref 13.0–17.0)
MCH: 28.8 pg (ref 26.0–34.0)
MCHC: 32.4 g/dL (ref 30.0–36.0)
MCV: 89.1 fL (ref 80.0–100.0)
Platelets: 214 10*3/uL (ref 150–400)
RBC: 4.75 MIL/uL (ref 4.22–5.81)
RDW: 12.4 % (ref 11.5–15.5)
WBC: 8.2 10*3/uL (ref 4.0–10.5)
nRBC: 0 % (ref 0.0–0.2)

## 2021-06-11 LAB — BASIC METABOLIC PANEL
Anion gap: 6 (ref 5–15)
BUN: 13 mg/dL (ref 6–20)
CO2: 27 mmol/L (ref 22–32)
Calcium: 9.2 mg/dL (ref 8.9–10.3)
Chloride: 101 mmol/L (ref 98–111)
Creatinine, Ser: 1 mg/dL (ref 0.61–1.24)
GFR, Estimated: >60 mL/min (ref >60)
Glucose, Bld: 110 mg/dL — ABNORMAL HIGH (ref 70–99)
Potassium: 3.9 mmol/L (ref 3.5–5.1)
Sodium: 134 mmol/L — ABNORMAL LOW (ref 135–145)

## 2021-06-11 LAB — TROPONIN I (HIGH SENSITIVITY)
Troponin I (High Sensitivity): 4 ng/L (ref <18)
Troponin I (High Sensitivity): 4 ng/L (ref <18)

## 2021-06-11 NOTE — ED Triage Notes (Signed)
Pt reports chest pain, nausea, back pain, and sweating since 7am this morning. Denies shortness of breath and abdominal pain.

## 2021-06-11 NOTE — Telephone Encounter (Signed)
Patient states he woke up with heart burn and a headache, patient requested ov for today, informed patient there was no ov available for today, offered patient ov w/ another [facility] provider for tomorrow 12-1, patient declined stating he needed to be seen today, advised patient to go to urgent care, patient understood and agreed

## 2021-06-11 NOTE — ED Notes (Signed)
An After Visit Summary was printed and given to the patient. Discharge instructions given and no further questions at this time.  

## 2021-06-11 NOTE — ED Provider Notes (Signed)
[name] Provider Note   CSN: [id number] Arrival date & time: 06/11/21  1005     History Chief Complaint  Patient presents with   Chest Pain    Mcclain KEIL PICKERING is a 55 y.o. male with a past medical history of hyperlipidemia and GERD presenting today with complaint of chest pain that radiated to his shoulder and neck this morning.  Reports he feels all better now.  Patient stated that he spoke [place], but difficulties understanding with the [employer] interpreter.  He has a note from a nurse at work that reports that he had had a coffee and corn dog this morning and then complained of this pain.  Denies shortness of breath, dizziness or syncope    Past Medical History:  Diagnosis Date   GERD (gastroesophageal reflux disease)     on meds   High cholesterol    Hyperlipidemia    on meds   Sleep apnea    uses CPAP    Patient Active Problem List   Diagnosis Date Noted   Dyslipidemia 12/30/2020   History of gastroesophageal reflux (GERD) 12/30/2020   History of sleep apnea 12/30/2020   OSA (obstructive sleep apnea) 06/27/2015   Carpal tunnel syndrome, bilateral 04/29/2012    Past Surgical History:  Procedure Laterality Date   NO PAST SURGERIES         Family History  Problem Relation Age of Onset   Thyroid disease Mother    Hyperlipidemia Mother    Stroke Mother    Hyperlipidemia Father    Hypertension Father    Stroke Father    Colon polyps Neg Hx    Colon cancer Neg Hx    Esophageal cancer Neg Hx    Rectal cancer Neg Hx    Stomach cancer Neg Hx     Social History   Tobacco Use   Smoking status: Former    Types: Cigarettes    Quit date: 11/30/2006    Years since quitting: 14.5   Smokeless tobacco: Never  Vaping Use   Vaping Use: Never used  Substance Use Topics   Alcohol use: Not Currently   Drug use: No    Home Medications Prior to Admission medications   Medication Sig Start Date End Date Taking? Authorizing  Provider  pantoprazole (PROTONIX) 40 MG tablet TAKE 1 TABLET BY MOUTH EVERY DAY Patient not taking: No sig reported 08/05/20   Horald Pollen, MD  simvastatin (ZOCOR) 20 MG tablet TAKE 1 TABLET BY MOUTH EVERY DAY EVERY EVENING 06/27/20   Horald Pollen, MD    Allergies    Patient has no known allergies.  Review of Systems   Review of Systems  Reason unable to perform ROS: Difficult to obtain full ROS due to language barrier.  Respiratory:  Negative for shortness of breath.   Cardiovascular:  Positive for chest pain.  Gastrointestinal:  Negative for nausea and vomiting.  Neurological:  Positive for headaches.   Physical Exam Updated Vital Signs BP 122/68   Pulse 62   Temp 97.7 F (36.5 C) (Oral)   Resp 18   SpO2 99%   Physical Exam Vitals and nursing note reviewed.  Constitutional:      General: He is not in acute distress.    Appearance: Normal appearance. He is not ill-appearing.  HENT:     Head: Normocephalic and atraumatic.  Eyes:     General: No scleral icterus.    Conjunctiva/sclera: Conjunctivae normal.  Cardiovascular:  Rate and Rhythm: Normal rate and regular rhythm.  Pulmonary:     Effort: Pulmonary effort is normal. No respiratory distress.     Breath sounds: Normal breath sounds.  Abdominal:     Tenderness: There is no abdominal tenderness.  Skin:    General: Skin is warm and dry.     Findings: No rash.  Neurological:     Mental Status: He is alert.  Psychiatric:        Mood and Affect: Mood normal.    ED Results / Procedures / Treatments   Labs (all labs ordered are listed, but only abnormal results are displayed) Labs Reviewed  BASIC METABOLIC PANEL - Abnormal; Notable for the following components:      Result Value   Sodium 134 (*)    Glucose, Bld 110 (*)    All other components within normal limits  CBC  TROPONIN I (HIGH SENSITIVITY)  TROPONIN I (HIGH SENSITIVITY)    EKG None  Radiology DG Chest 2 View  Result  Date: 06/11/2021 CLINICAL DATA:  Chest pain EXAM: CHEST - 2 VIEW COMPARISON:  2018 FINDINGS: The heart size and mediastinal contours are within normal limits. Persistent mild elevation right hemidiaphragm. Both lungs are clear. No pleural effusion or pneumothorax. The visualized skeletal structures are unremarkable. IMPRESSION: No acute process in the chest. Electronically Signed   By: Macy Mis M.D.   On: 06/11/2021 11:05    Procedures Procedures   Medications Ordered in ED Medications - No data to display  ED Course  I have reviewed the triage vital signs and the nursing notes.  Pertinent labs & imaging results that were available during my care of the patient were reviewed by me and considered in my medical decision making (see chart for details).    MDM Rules/Calculators/A&P The emergent differential diagnosis of chest pain includes: Acute coronary syndrome, pericarditis, aortic dissection, pulmonary embolism, tension pneumothorax, and esophageal rupture.  All of these were considered throughout the evaluation of the patient.  There was a large language barrier due to inability to find his dialect with the interpreter.  Patient reported that [place] was a good choice however [employer] interpreter reports that he is having a difficult time expressing himself in [place].  Troponins negative.  Chest x-ray normal.  EKG and heart auscultation normal.  When I went to see the patient after his full evaluation previous to my arrival he reported that he no longer had any symptoms.  At this time I believe he is stable for discharge.  Final Clinical Impression(s) / ED Diagnoses Final diagnoses:  Atypical chest pain    Rx / DC Orders Results and diagnoses were explained to the patient in [place]. Return precautions discussed in full. Patient had no additional questions.  Information about chest pain attaches papers in [place].    Rhae Hammock, PA-C 06/11/21 1850    Drenda Freeze, MD 06/11/21 [phone]

## 2021-07-01 ENCOUNTER — Ambulatory Visit: Payer: BC Managed Care – PPO | Admitting: Emergency Medicine

## 2021-07-09 ENCOUNTER — Ambulatory Visit (INDEPENDENT_AMBULATORY_CARE_PROVIDER_SITE_OTHER): Payer: BC Managed Care – PPO

## 2021-07-09 ENCOUNTER — Other Ambulatory Visit: Payer: Self-pay

## 2021-07-09 DIAGNOSIS — Z23 Encounter for immunization: Secondary | ICD-10-CM

## 2021-07-09 NOTE — Progress Notes (Signed)
Pt given 2nd Zoster vacc w/o any complications. 

## 2021-07-18 DIAGNOSIS — G4733 Obstructive sleep apnea (adult) (pediatric): Secondary | ICD-10-CM | POA: Diagnosis not present

## 2022-04-15 ENCOUNTER — Encounter: Payer: Self-pay | Admitting: Emergency Medicine

## 2022-04-15 ENCOUNTER — Ambulatory Visit (INDEPENDENT_AMBULATORY_CARE_PROVIDER_SITE_OTHER): Payer: BC Managed Care – PPO | Admitting: Emergency Medicine

## 2022-04-15 VITALS — BP 116/64 | HR 60 | Temp 98.7°F | Ht 68.0 in | Wt 210.0 lb

## 2022-04-15 DIAGNOSIS — Z13228 Encounter for screening for other metabolic disorders: Secondary | ICD-10-CM

## 2022-04-15 DIAGNOSIS — Z13220 Encounter for screening for lipoid disorders: Secondary | ICD-10-CM

## 2022-04-15 DIAGNOSIS — Z0000 Encounter for general adult medical examination without abnormal findings: Secondary | ICD-10-CM

## 2022-04-15 DIAGNOSIS — Z130 Encounter for screening for diseases of the blood and blood-forming organs and certain disorders involving the immune mechanism: Secondary | ICD-10-CM

## 2022-04-15 DIAGNOSIS — Z125 Encounter for screening for malignant neoplasm of prostate: Secondary | ICD-10-CM

## 2022-04-15 DIAGNOSIS — Z1329 Encounter for screening for other suspected endocrine disorder: Secondary | ICD-10-CM

## 2022-04-15 LAB — LIPID PANEL
Cholesterol: 175 mg/dL (ref 0–200)
HDL: 50.7 mg/dL (ref >39.00)
LDL Cholesterol: 92 mg/dL (ref 0–99)
NonHDL: 124.49
Total CHOL/HDL Ratio: 3
Triglycerides: 164 mg/dL — ABNORMAL HIGH (ref 0.0–149.0)
VLDL: 32.8 mg/dL (ref 0.0–40.0)

## 2022-04-15 LAB — CBC WITH DIFFERENTIAL/PLATELET
Basophils Absolute: 0 10*3/uL (ref 0.0–0.1)
Basophils Relative: 0.9 % (ref 0.0–3.0)
Eosinophils Absolute: 0.2 10*3/uL (ref 0.0–0.7)
Eosinophils Relative: 4.3 % (ref 0.0–5.0)
HCT: 40.7 % (ref 39.0–52.0)
Hemoglobin: 13.4 g/dL (ref 13.0–17.0)
Lymphocytes Relative: 31.2 % (ref 12.0–46.0)
Lymphs Abs: 1.4 10*3/uL (ref 0.7–4.0)
MCHC: 33.1 g/dL (ref 30.0–36.0)
MCV: 86.9 fl (ref 78.0–100.0)
Monocytes Absolute: 0.3 10*3/uL (ref 0.1–1.0)
Monocytes Relative: 7 % (ref 3.0–12.0)
Neutro Abs: 2.6 10*3/uL (ref 1.4–7.7)
Neutrophils Relative %: 56.6 % (ref 43.0–77.0)
Platelets: 200 10*3/uL (ref 150.0–400.0)
RBC: 4.68 Mil/uL (ref 4.22–5.81)
RDW: 13.5 % (ref 11.5–15.5)
WBC: 4.5 10*3/uL (ref 4.0–10.5)

## 2022-04-15 LAB — COMPREHENSIVE METABOLIC PANEL
ALT: 21 U/L (ref 0–53)
AST: 18 U/L (ref 0–37)
Albumin: 4.6 g/dL (ref 3.5–5.2)
Alkaline Phosphatase: 58 U/L (ref 39–117)
BUN: 13 mg/dL (ref 6–23)
CO2: 29 mEq/L (ref 19–32)
Calcium: 9.3 mg/dL (ref 8.4–10.5)
Chloride: 102 mEq/L (ref 96–112)
Creatinine, Ser: 1.01 mg/dL (ref 0.40–1.50)
GFR: 84.47 mL/min (ref >60.00)
Glucose, Bld: 82 mg/dL (ref 70–99)
Potassium: 4 mEq/L (ref 3.5–5.1)
Sodium: 138 mEq/L (ref 135–145)
Total Bilirubin: 0.8 mg/dL (ref 0.2–1.2)
Total Protein: 7.6 g/dL (ref 6.0–8.3)

## 2022-04-15 LAB — PSA: PSA: 0.47 ng/mL (ref 0.10–4.00)

## 2022-04-15 LAB — HEMOGLOBIN A1C: Hgb A1c MFr Bld: 6.1 % (ref 4.6–6.5)

## 2022-04-15 NOTE — Patient Instructions (Signed)
Health Maintenance, Male Adopting a healthy lifestyle and getting preventive care are important in promoting health and wellness. Ask your health care provider about: The right schedule for you to have regular tests and exams. Things you can do on your own to prevent diseases and keep yourself healthy. What should I know about diet, weight, and exercise? Eat a healthy diet  Eat a diet that includes plenty of vegetables, fruits, low-fat dairy products, and lean protein. Do not eat a lot of foods that are high in solid fats, added sugars, or sodium. Maintain a healthy weight Body mass index (BMI) is a measurement that can be used to identify possible weight problems. It estimates body fat based on height and weight. Your health care provider can help determine your BMI and help you achieve or maintain a healthy weight. Get regular exercise Get regular exercise. This is one of the most important things you can do for your health. Most adults should: Exercise for at least 150 minutes each week. The exercise should increase your heart rate and make you sweat (moderate-intensity exercise). Do strengthening exercises at least twice a week. This is in addition to the moderate-intensity exercise. Spend less time sitting. Even light physical activity can be beneficial. Watch cholesterol and blood lipids Have your blood tested for lipids and cholesterol at 54 years of age, then have this test every 5 years. You may need to have your cholesterol levels checked more often if: Your lipid or cholesterol levels are high. You are older than 54 years of age. You are at high risk for heart disease. What should I know about cancer screening? Many types of cancers can be detected early and may often be prevented. Depending on your health history and family history, you may need to have cancer screening at various ages. This may include screening for: Colorectal cancer. Prostate cancer. Skin cancer. Lung  cancer. What should I know about heart disease, diabetes, and high blood pressure? Blood pressure and heart disease High blood pressure causes heart disease and increases the risk of stroke. This is more likely to develop in people who have high blood pressure readings or are overweight. Talk with your health care provider about your target blood pressure readings. Have your blood pressure checked: Every 3-5 years if you are 18-39 years of age. Every year if you are 40 years old or older. If you are between the ages of 65 and 75 and are a current or former smoker, ask your health care provider if you should have a one-time screening for abdominal aortic aneurysm (AAA). Diabetes Have regular diabetes screenings. This checks your fasting blood sugar level. Have the screening done: Once every three years after age 45 if you are at a normal weight and have a low risk for diabetes. More often and at a younger age if you are overweight or have a high risk for diabetes. What should I know about preventing infection? Hepatitis B If you have a higher risk for hepatitis B, you should be screened for this virus. Talk with your health care provider to find out if you are at risk for hepatitis B infection. Hepatitis C Blood testing is recommended for: Everyone born from 1945 through 1965. Anyone with known risk factors for hepatitis C. Sexually transmitted infections (STIs) You should be screened each year for STIs, including gonorrhea and chlamydia, if: You are sexually active and are younger than 54 years of age. You are older than 54 years of age and your   health care provider tells you that you are at risk for this type of infection. Your sexual activity has changed since you were last screened, and you are at increased risk for chlamydia or gonorrhea. Ask your health care provider if you are at risk. Ask your health care provider about whether you are at high risk for HIV. Your health care provider  may recommend a prescription medicine to help prevent HIV infection. If you choose to take medicine to prevent HIV, you should first get tested for HIV. You should then be tested every 3 months for as long as you are taking the medicine. Follow these instructions at home: Alcohol use Do not drink alcohol if your health care provider tells you not to drink. If you drink alcohol: Limit how much you have to 0-2 drinks a day. Know how much alcohol is in your drink. In the [place] one drink equals one 12 oz bottle of beer (355 mL), one 5 oz glass of wine (148 mL), or one 1 oz glass of hard liquor (44 mL). Lifestyle Do not use any products that contain nicotine or tobacco. These products include cigarettes, chewing tobacco, and vaping devices, such as e-cigarettes. If you need help quitting, ask your health care provider. Do not use street drugs. Do not share needles. Ask your health care provider for help if you need support or information about quitting drugs. General instructions Schedule regular health, dental, and eye exams. Stay current with your vaccines. Tell your health care provider if: You often feel depressed. You have ever been abused or do not feel safe at home. Summary Adopting a healthy lifestyle and getting preventive care are important in promoting health and wellness. Follow your health care provider's instructions about healthy diet, exercising, and getting tested or screened for diseases. Follow your health care provider's instructions on monitoring your cholesterol and blood pressure. This information is not intended to replace advice given to you by your health care provider. Make sure you discuss any questions you have with your health care provider. Document Revised: 11/18/2020 Document Reviewed: 11/18/2020 Elsevier Patient Education  2023 [employer] Inc[employer]  

## 2022-04-15 NOTE — Progress Notes (Signed)
[name] 56 y.o.   Chief Complaint  Patient presents with   Annual Exam    Left ear burning in the morning time    HISTORY OF PRESENT ILLNESS: This is a 56 y.o. male here for vital exam. Only complaint is occasional left ear burning mostly in the morning. No other complaints or medical concerns today. Accompanied by interpreter.  HPI   Prior to Admission medications   Medication Sig Start Date End Date Taking? Authorizing Provider  simvastatin (ZOCOR) 20 MG tablet TAKE 1 TABLET BY MOUTH EVERY DAY EVERY EVENING 06/27/20  Yes Philomene Haff, Ines Bloomer, MD  pantoprazole (Falman) 40 MG tablet TAKE 1 TABLET BY MOUTH EVERY DAY Patient not taking: Reported on 12/30/2020 08/05/20   Horald Pollen, MD    No Known Allergies  Patient Active Problem List   Diagnosis Date Noted   Dyslipidemia 12/30/2020   History of gastroesophageal reflux (GERD) 12/30/2020   History of sleep apnea 12/30/2020   OSA (obstructive sleep apnea) 06/27/2015   Carpal tunnel syndrome, bilateral 04/29/2012    Past Medical History:  Diagnosis Date   GERD (gastroesophageal reflux disease)     on meds   High cholesterol    Hyperlipidemia    on meds   Sleep apnea    uses CPAP    Past Surgical History:  Procedure Laterality Date   NO PAST SURGERIES      Social History   Socioeconomic History   Marital status: Married    Spouse name: Not on file   Number of children: Not on file   Years of education: Not on file   Highest education level: Not on file  Occupational History   Not on file  Tobacco Use   Smoking status: Former    Types: Cigarettes    Quit date: 11/30/2006    Years since quitting: 15.3   Smokeless tobacco: Never  Vaping Use   Vaping Use: Never used  Substance and Sexual Activity   Alcohol use: Not Currently   Drug use: No   Sexual activity: Not on file  Other Topics Concern   Not on file  Social History Narrative   ** Merged History Encounter **       Social  Determinants of Health   Financial Resource Strain: Not on file  Food Insecurity: Not on file  Transportation Needs: Not on file  Physical Activity: Not on file  Stress: Not on file  Social Connections: Not on file  Intimate Partner Violence: Not on file    Family History  Problem Relation Age of Onset   Thyroid disease Mother    Hyperlipidemia Mother    Stroke Mother    Hyperlipidemia Father    Hypertension Father    Stroke Father    Colon polyps Neg Hx    Colon cancer Neg Hx    Esophageal cancer Neg Hx    Rectal cancer Neg Hx    Stomach cancer Neg Hx      Review of Systems  Constitutional: Negative.  Negative for chills and fever.  HENT: Negative.  Negative for congestion and sore throat.   Respiratory: Negative.  Negative for cough.   Cardiovascular: Negative.  Negative for chest pain and palpitations.  Gastrointestinal: Negative.  Negative for abdominal pain, nausea and vomiting.  Skin: Negative.  Negative for rash.  Neurological: Negative.  Negative for dizziness and headaches.  All other systems reviewed and are negative.  Today's Vitals   04/15/22 0905  BP: 116/64  Pulse: 60  Temp: 98.7 F (37.1 C)  TempSrc: Oral  SpO2: 98%  Weight: 210 lb (95.3 kg)  Height: '5\' 8"'$  (1.727 m)   Body mass index is 31.93 kg/m.   Physical Exam Vitals reviewed.  Constitutional:      Appearance: Normal appearance.  HENT:     Head: Normocephalic.     Right Ear: Tympanic membrane, ear canal and external ear normal.     Left Ear: Tympanic membrane, ear canal and external ear normal.     Mouth/Throat:     Mouth: Mucous membranes are moist.     Pharynx: Oropharynx is clear.  Eyes:     Extraocular Movements: Extraocular movements intact.     Conjunctiva/sclera: Conjunctivae normal.     Pupils: Pupils are equal, round, and reactive to light.  Cardiovascular:     Rate and Rhythm: Normal rate and regular rhythm.     Pulses: Normal pulses.     Heart sounds: Normal heart  sounds.  Pulmonary:     Effort: Pulmonary effort is normal.     Breath sounds: Normal breath sounds.  Abdominal:     Palpations: Abdomen is soft. There is no mass.     Tenderness: There is no abdominal tenderness.  Musculoskeletal:        General: Normal range of motion.     Cervical back: No tenderness.     Right lower leg: No edema.     Left lower leg: No edema.  Lymphadenopathy:     Cervical: No cervical adenopathy.  Skin:    General: Skin is warm and dry.  Neurological:     General: No focal deficit present.     Mental Status: He is alert and oriented to person, place, and time.  Psychiatric:        Mood and Affect: Mood normal.        Behavior: Behavior normal.      ASSESSMENT & PLAN: Problem List Items Addressed This Visit   None Visit Diagnoses     Routine general medical examination at a health care facility    -  Primary   Prostate cancer screening       Relevant Orders   PSA(Must document that pt has been informed of limitations of PSA testing.)   Screening for deficiency anemia       Relevant Orders   CBC with Differential   Screening for lipoid disorders       Relevant Orders   Lipid panel   Screening for endocrine, metabolic and immunity disorder       Relevant Orders   Comprehensive metabolic panel   Hemoglobin A1c      Modifiable risk factors discussed with patient. Anticipatory guidance according to age provided. The following topics were also discussed: Social Determinants of Health Smoking.  Non-smoker Diet and nutrition Benefits of exercise Cancer screening and review of most recent colonoscopy report Vaccinations reviewed and recommendations Cardiovascular risk assessment and need for blood work The 10-year ASCVD risk score (Arnett DK, et al., 2019) is: 3.7%   Values used to calculate the score:     Age: 15 years     Sex: Male     Is Non-Hispanic African American: No     Diabetic: No     Tobacco smoker: No     Systolic Blood  Pressure: 116 mmHg     Is BP treated: No     HDL Cholesterol: 49.6 mg/dL     Total Cholesterol: 174 mg/dL  Mental health including depression and anxiety Fall and accident prevention  Patient Instructions  Health Maintenance, Male Adopting a healthy lifestyle and getting preventive care are important in promoting health and wellness. Ask your health care provider about: The right schedule for you to have regular tests and exams. Things you can do on your own to prevent diseases and keep yourself healthy. What should I know about diet, weight, and exercise? Eat a healthy diet  Eat a diet that includes plenty of vegetables, fruits, low-fat dairy products, and lean protein. Do not eat a lot of foods that are high in solid fats, added sugars, or sodium. Maintain a healthy weight Body mass index (BMI) is a measurement that can be used to identify possible weight problems. It estimates body fat based on height and weight. Your health care provider can help determine your BMI and help you achieve or maintain a healthy weight. Get regular exercise Get regular exercise. This is one of the most important things you can do for your health. Most adults should: Exercise for at least 150 minutes each week. The exercise should increase your heart rate and make you sweat (moderate-intensity exercise). Do strengthening exercises at least twice a week. This is in addition to the moderate-intensity exercise. Spend less time sitting. Even light physical activity can be beneficial. Watch cholesterol and blood lipids Have your blood tested for lipids and cholesterol at 54 years of age, then have this test every 5 years. You may need to have your cholesterol levels checked more often if: Your lipid or cholesterol levels are high. You are older than 54 years of age. You are at high risk for heart disease. What should I know about cancer screening? Many types of cancers can be detected early and may often be  prevented. Depending on your health history and family history, you may need to have cancer screening at various ages. This may include screening for: Colorectal cancer. Prostate cancer. Skin cancer. Lung cancer. What should I know about heart disease, diabetes, and high blood pressure? Blood pressure and heart disease High blood pressure causes heart disease and increases the risk of stroke. This is more likely to develop in people who have high blood pressure readings or are overweight. Talk with your health care provider about your target blood pressure readings. Have your blood pressure checked: Every 3-5 years if you are 59-12 years of age. Every year if you are 40 years old or older. If you are between the ages of 55 and 64 and are a current or former smoker, ask your health care provider if you should have a one-time screening for abdominal aortic aneurysm (AAA). Diabetes Have regular diabetes screenings. This checks your fasting blood sugar level. Have the screening done: Once every three years after age 43 if you are at a normal weight and have a low risk for diabetes. More often and at a younger age if you are overweight or have a high risk for diabetes. What should I know about preventing infection? Hepatitis B If you have a higher risk for hepatitis B, you should be screened for this virus. Talk with your health care provider to find out if you are at risk for hepatitis B infection. Hepatitis C Blood testing is recommended for: Everyone born from [birth year] through 1965. Anyone with known risk factors for hepatitis C. Sexually transmitted infections (STIs) You should be screened each year for STIs, including gonorrhea and chlamydia, if: You are sexually active and are younger than  54 years of age. You are older than 54 years of age and your health care provider tells you that you are at risk for this type of infection. Your sexual activity has changed since you were last screened,  and you are at increased risk for chlamydia or gonorrhea. Ask your health care provider if you are at risk. Ask your health care provider about whether you are at high risk for HIV. Your health care provider may recommend a prescription medicine to help prevent HIV infection. If you choose to take medicine to prevent HIV, you should first get tested for HIV. You should then be tested every 3 months for as long as you are taking the medicine. Follow these instructions at home: Alcohol use Do not drink alcohol if your health care provider tells you not to drink. If you drink alcohol: Limit how much you have to 0-2 drinks a day. Know how much alcohol is in your drink. In the [place] one drink equals one 12 oz bottle of beer (355 mL), one 5 oz glass of wine (148 mL), or one 1 oz glass of hard liquor (44 mL). Lifestyle Do not use any products that contain nicotine or tobacco. These products include cigarettes, chewing tobacco, and vaping devices, such as e-cigarettes. If you need help quitting, ask your health care provider. Do not use street drugs. Do not share needles. Ask your health care provider for help if you need support or information about quitting drugs. General instructions Schedule regular health, dental, and eye exams. Stay current with your vaccines. Tell your health care provider if: You often feel depressed. You have ever been abused or do not feel safe at home. Summary Adopting a healthy lifestyle and getting preventive care are important in promoting health and wellness. Follow your health care provider's instructions about healthy diet, exercising, and getting tested or screened for diseases. Follow your health care provider's instructions on monitoring your cholesterol and blood pressure. This information is not intended to replace advice given to you by your health care provider. Make sure you discuss any questions you have with your health care provider. Document Revised:  11/18/2020 Document Reviewed: 11/18/2020 Elsevier Patient Education  [place], MD [facility]

## 2022-04-24 ENCOUNTER — Telehealth: Payer: Self-pay | Admitting: Emergency Medicine

## 2022-04-24 NOTE — Telephone Encounter (Signed)
Patient would like a copy of his labs sent to his employer.  Please fax to [phone]

## 2022-04-27 NOTE — Telephone Encounter (Signed)
Called patient and left message to inform him that we are unable to send any labs to his employer without a medical release form on file.

## 2022-07-14 DIAGNOSIS — H40033 Anatomical narrow angle, bilateral: Secondary | ICD-10-CM | POA: Diagnosis not present

## 2022-07-14 DIAGNOSIS — H5213 Myopia, bilateral: Secondary | ICD-10-CM | POA: Diagnosis not present

## 2022-12-09 ENCOUNTER — Ambulatory Visit: Payer: BC Managed Care – PPO | Admitting: Emergency Medicine

## 2022-12-09 MED ORDER — ATOVAQUONE-PROGUANIL HCL 250-100 MG PO TABS: ORAL_TABLET | ORAL | 0 refills | Status: DC

## 2022-12-16 DIAGNOSIS — G4733 Obstructive sleep apnea (adult) (pediatric): Secondary | ICD-10-CM | POA: Diagnosis not present

## 2023-06-23 ENCOUNTER — Ambulatory Visit: Payer: BC Managed Care – PPO | Admitting: Emergency Medicine

## 2023-06-23 ENCOUNTER — Encounter: Payer: Self-pay | Admitting: Emergency Medicine

## 2023-06-23 VITALS — BP 114/78 | HR 64 | Temp 97.8°F | Ht 68.0 in | Wt 209.0 lb

## 2023-06-23 DIAGNOSIS — E785 Hyperlipidemia, unspecified: Secondary | ICD-10-CM

## 2023-06-23 DIAGNOSIS — K219 Gastro-esophageal reflux disease without esophagitis: Secondary | ICD-10-CM | POA: Diagnosis not present

## 2023-06-23 DIAGNOSIS — G4733 Obstructive sleep apnea (adult) (pediatric): Secondary | ICD-10-CM | POA: Diagnosis not present

## 2023-06-23 DIAGNOSIS — Z0000 Encounter for general adult medical examination without abnormal findings: Secondary | ICD-10-CM

## 2023-06-23 DIAGNOSIS — M549 Dorsalgia, unspecified: Secondary | ICD-10-CM

## 2023-06-23 DIAGNOSIS — Z13228 Encounter for screening for other metabolic disorders: Secondary | ICD-10-CM | POA: Diagnosis not present

## 2023-06-23 DIAGNOSIS — Z125 Encounter for screening for malignant neoplasm of prostate: Secondary | ICD-10-CM

## 2023-06-23 DIAGNOSIS — Z1329 Encounter for screening for other suspected endocrine disorder: Secondary | ICD-10-CM

## 2023-06-23 DIAGNOSIS — Z130 Encounter for screening for diseases of the blood and blood-forming organs and certain disorders involving the immune mechanism: Secondary | ICD-10-CM

## 2023-06-23 DIAGNOSIS — Z0001 Encounter for general adult medical examination with abnormal findings: Secondary | ICD-10-CM

## 2023-06-23 LAB — CBC WITH DIFFERENTIAL/PLATELET
Basophils Absolute: 0 10*3/uL (ref 0.0–0.1)
Basophils Relative: 0.8 % (ref 0.0–3.0)
Eosinophils Absolute: 0.2 10*3/uL (ref 0.0–0.7)
Eosinophils Relative: 4.6 % (ref 0.0–5.0)
HCT: 42.9 % (ref 39.0–52.0)
Hemoglobin: 13.8 g/dL (ref 13.0–17.0)
Lymphocytes Relative: 32.3 % (ref 12.0–46.0)
Lymphs Abs: 1.5 10*3/uL (ref 0.7–4.0)
MCHC: 32.1 g/dL (ref 30.0–36.0)
MCV: 88 fL (ref 78.0–100.0)
Monocytes Absolute: 0.4 10*3/uL (ref 0.1–1.0)
Monocytes Relative: 8.2 % (ref 3.0–12.0)
Neutro Abs: 2.4 10*3/uL (ref 1.4–7.7)
Neutrophils Relative %: 54.1 % (ref 43.0–77.0)
Platelets: 207 10*3/uL (ref 150.0–400.0)
RBC: 4.88 Mil/uL (ref 4.22–5.81)
RDW: 13.4 % (ref 11.5–15.5)
WBC: 4.5 10*3/uL (ref 4.0–10.5)

## 2023-06-23 LAB — COMPREHENSIVE METABOLIC PANEL
ALT: 23 U/L (ref 0–53)
AST: 20 U/L (ref 0–37)
Albumin: 4.6 g/dL (ref 3.5–5.2)
Alkaline Phosphatase: 61 U/L (ref 39–117)
BUN: 13 mg/dL (ref 6–23)
CO2: 28 meq/L (ref 19–32)
Calcium: 9.5 mg/dL (ref 8.4–10.5)
Chloride: 103 meq/L (ref 96–112)
Creatinine, Ser: 0.97 mg/dL (ref 0.40–1.50)
GFR: 87.93 mL/min (ref >60.00)
Glucose, Bld: 91 mg/dL (ref 70–99)
Potassium: 4.5 meq/L (ref 3.5–5.1)
Sodium: 139 meq/L (ref 135–145)
Total Bilirubin: 1 mg/dL (ref 0.2–1.2)
Total Protein: 7.7 g/dL (ref 6.0–8.3)

## 2023-06-23 LAB — PSA: PSA: 0.49 ng/mL (ref 0.10–4.00)

## 2023-06-23 LAB — HEMOGLOBIN A1C: Hgb A1c MFr Bld: 6.2 % (ref 4.6–6.5)

## 2023-06-23 LAB — LIPID PANEL
Cholesterol: 167 mg/dL (ref 0–200)
HDL: 51.5 mg/dL (ref >39.00)
LDL Cholesterol: 82 mg/dL (ref 0–99)
NonHDL: 115.71
Total CHOL/HDL Ratio: 3
Triglycerides: 167 mg/dL — ABNORMAL HIGH (ref 0.0–149.0)
VLDL: 33.4 mg/dL (ref 0.0–40.0)

## 2023-06-23 NOTE — Progress Notes (Signed)
[name] 57 y.o.   Chief Complaint  Patient presents with   Annual Exam    Patient c/o left neck/ shoulder pain some pain in the right side too that's been happening for 2-3 years. Mostly painful in the morning     HISTORY OF PRESENT ILLNESS: This is a 57 y.o. male here for annual exam and follow-up on chronic medical conditions including dyslipidemia Also complaining of pain to left upper back area, mostly in the morning.  Gets better during the day.  Patient is a Estate agent. Has no other complaints or medical concerns today.  HPI   Prior to Admission medications   Medication Sig Start Date End Date Taking? Authorizing Provider  pantoprazole (PROTONIX) 40 MG tablet TAKE 1 TABLET BY MOUTH EVERY DAY 08/05/20  Yes Lamonta Cypress, Eilleen Kempf, MD  simvastatin (ZOCOR) 20 MG tablet TAKE 1 TABLET BY MOUTH EVERY DAY EVERY EVENING 06/27/20  Yes Marlicia Sroka, Eilleen Kempf, MD  atovaquone-proguanil (MALARONE) 250-100 MG TABS tablet Start 1 tablet daily 2 days before leaving, daily while abroad, daily for 7 days after returning Patient not taking: Reported on 06/23/2023 12/09/22   Georgina Quint, MD    No Known Allergies  Patient Active Problem List   Diagnosis Date Noted   Dyslipidemia 12/30/2020   History of gastroesophageal reflux (GERD) 12/30/2020   History of sleep apnea 12/30/2020   OSA (obstructive sleep apnea) 06/27/2015   Carpal tunnel syndrome, bilateral 04/29/2012    Past Medical History:  Diagnosis Date   GERD (gastroesophageal reflux disease)     on meds   High cholesterol    Hyperlipidemia    on meds   Sleep apnea    uses CPAP    Past Surgical History:  Procedure Laterality Date   NO PAST SURGERIES      Social History   Socioeconomic History   Marital status: Married    Spouse name: Not on file   Number of children: Not on file   Years of education: Not on file   Highest education level: Not on file  Occupational History   Not on file  Tobacco  Use   Smoking status: Former    Current packs/day: 0.00    Types: Cigarettes    Quit date: 11/30/2006    Years since quitting: 16.5   Smokeless tobacco: Never  Vaping Use   Vaping status: Never Used  Substance and Sexual Activity   Alcohol use: Not Currently   Drug use: No   Sexual activity: Not on file  Other Topics Concern   Not on file  Social History Narrative   ** Merged History Encounter **       Social Determinants of Health   Financial Resource Strain: Not on file  Food Insecurity: Not on file  Transportation Needs: Not on file  Physical Activity: Not on file  Stress: Not on file  Social Connections: Not on file  Intimate Partner Violence: Not on file    Family History  Problem Relation Age of Onset   Thyroid disease Mother    Hyperlipidemia Mother    Stroke Mother    Hyperlipidemia Father    Hypertension Father    Stroke Father    Colon polyps Neg Hx    Colon cancer Neg Hx    Esophageal cancer Neg Hx    Rectal cancer Neg Hx    Stomach cancer Neg Hx      Review of Systems  Constitutional: Negative.  Negative for chills and  fever.  HENT: Negative.  Negative for congestion and sore throat.   Respiratory: Negative.  Negative for cough and shortness of breath.   Cardiovascular: Negative.  Negative for chest pain and palpitations.  Gastrointestinal:  Negative for abdominal pain, diarrhea, nausea and vomiting.  Genitourinary: Negative.  Negative for dysuria and hematuria.  Musculoskeletal:  Positive for back pain.  Skin: Negative.  Negative for rash.  Neurological: Negative.  Negative for dizziness and headaches.  All other systems reviewed and are negative.   Vitals:   06/23/23 0926  BP: 114/78  Pulse: 64  Temp: 97.8 F (36.6 C)  SpO2: 95%    Physical Exam Vitals reviewed.  Constitutional:      Appearance: Normal appearance.  HENT:     Head: Normocephalic.     Right Ear: Tympanic membrane, ear canal and external ear normal.     Left Ear:  Tympanic membrane, ear canal and external ear normal.     Mouth/Throat:     Mouth: Mucous membranes are moist.     Pharynx: Oropharynx is clear.  Eyes:     Extraocular Movements: Extraocular movements intact.     Conjunctiva/sclera: Conjunctivae normal.     Pupils: Pupils are equal, round, and reactive to light.  Cardiovascular:     Rate and Rhythm: Normal rate and regular rhythm.     Pulses: Normal pulses.     Heart sounds: Normal heart sounds.  Pulmonary:     Effort: Pulmonary effort is normal.     Breath sounds: Normal breath sounds.  Abdominal:     Palpations: Abdomen is soft.     Tenderness: There is no abdominal tenderness.  Musculoskeletal:     Cervical back: No tenderness.  Lymphadenopathy:     Cervical: No cervical adenopathy.  Skin:    General: Skin is warm and dry.     Capillary Refill: Capillary refill takes less than 2 seconds.  Neurological:     General: No focal deficit present.     Mental Status: He is alert and oriented to person, place, and time.  Psychiatric:        Mood and Affect: Mood normal.        Behavior: Behavior normal.      ASSESSMENT & PLAN: Problem List Items Addressed This Visit       Respiratory   OSA (obstructive sleep apnea)    Well-controlled.  No concerns.        Digestive   GERD (gastroesophageal reflux disease)    Clinically stable. Takes pantoprazole 40 mg as needed Diet and nutrition discussed Foods to avoid discussed        Other   Dyslipidemia    Abnormal lipid profile Cardiovascular risks associated with dyslipidemia discussed Diet and nutrition discussed Continue simvastatin 20 mg daily      Relevant Orders   Comprehensive metabolic panel   Lipid panel   Musculoskeletal back pain    Mostly in the morning when he wakes up.  Improves during the day Localized to left upper trapezius area Sleeps on that particular side Pain related to activities of daily life.  Mechanical in nature. Pain management  discussed      Other Visit Diagnoses     Encounter for general adult medical examination with abnormal findings    -  Primary   Relevant Orders   CBC with Differential   Comprehensive metabolic panel   Hemoglobin A1c   Lipid panel   PSA(Must document that pt has been informed of  limitations of PSA testing.)   Screening for prostate cancer       Relevant Orders   PSA(Must document that pt has been informed of limitations of PSA testing.)   Screening for deficiency anemia       Relevant Orders   CBC with Differential   Screening for endocrine, metabolic and immunity disorder       Relevant Orders   Comprehensive metabolic panel   Hemoglobin A1c      Modifiable risk factors discussed with patient. Anticipatory guidance according to age provided. The following topics were also discussed: Social Determinants of Health Smoking.  Non-smoker Diet and nutrition Benefits of exercise Cancer screening and review of most recent colonoscopy report Vaccinations reviewed and recommendations Cardiovascular risk assessment The 10-year ASCVD risk score (Arnett DK, et al., 2019) is: 3.9%   Values used to calculate the score:     Age: 57 years     Sex: Male     Is Non-Hispanic African American: No     Diabetic: No     Tobacco smoker: No     Systolic Blood Pressure: 114 mmHg     Is BP treated: No     HDL Cholesterol: 50.7 mg/dL     Total Cholesterol: 175 mg/dL  Mental health including depression and anxiety Fall and accident prevention  Patient Instructions  Health Maintenance, Male Adopting a healthy lifestyle and getting preventive care are important in promoting health and wellness. Ask your health care provider about: The right schedule for you to have regular tests and exams. Things you can do on your own to prevent diseases and keep yourself healthy. What should I know about diet, weight, and exercise? Eat a healthy diet  Eat a diet that includes plenty of vegetables,  fruits, low-fat dairy products, and lean protein. Do not eat a lot of foods that are high in solid fats, added sugars, or sodium. Maintain a healthy weight Body mass index (BMI) is a measurement that can be used to identify possible weight problems. It estimates body fat based on height and weight. Your health care provider can help determine your BMI and help you achieve or maintain a healthy weight. Get regular exercise Get regular exercise. This is one of the most important things you can do for your health. Most adults should: Exercise for at least 150 minutes each week. The exercise should increase your heart rate and make you sweat (moderate-intensity exercise). Do strengthening exercises at least twice a week. This is in addition to the moderate-intensity exercise. Spend less time sitting. Even light physical activity can be beneficial. Watch cholesterol and blood lipids Have your blood tested for lipids and cholesterol at 55 years of age, then have this test every 5 years. You may need to have your cholesterol levels checked more often if: Your lipid or cholesterol levels are high. You are older than 55 years of age. You are at high risk for heart disease. What should I know about cancer screening? Many types of cancers can be detected early and may often be prevented. Depending on your health history and family history, you may need to have cancer screening at various ages. This may include screening for: Colorectal cancer. Prostate cancer. Skin cancer. Lung cancer. What should I know about heart disease, diabetes, and high blood pressure? Blood pressure and heart disease High blood pressure causes heart disease and increases the risk of stroke. This is more likely to develop in people who have high blood pressure readings  or are overweight. Talk with your health care provider about your target blood pressure readings. Have your blood pressure checked: Every 3-5 years if you are  9-31 years of age. Every year if you are 27 years old or older. If you are between the ages of 9 and 69 and are a current or former smoker, ask your health care provider if you should have a one-time screening for abdominal aortic aneurysm (AAA). Diabetes Have regular diabetes screenings. This checks your fasting blood sugar level. Have the screening done: Once every three years after age 52 if you are at a normal weight and have a low risk for diabetes. More often and at a younger age if you are overweight or have a high risk for diabetes. What should I know about preventing infection? Hepatitis B If you have a higher risk for hepatitis B, you should be screened for this virus. Talk with your health care provider to find out if you are at risk for hepatitis B infection. Hepatitis C Blood testing is recommended for: Everyone born from [birth year] through 1963. Anyone with known risk factors for hepatitis C. Sexually transmitted infections (STIs) You should be screened each year for STIs, including gonorrhea and chlamydia, if: You are sexually active and are younger than 55 years of age. You are older than 55 years of age and your health care provider tells you that you are at risk for this type of infection. Your sexual activity has changed since you were last screened, and you are at increased risk for chlamydia or gonorrhea. Ask your health care provider if you are at risk. Ask your health care provider about whether you are at high risk for HIV. Your health care provider may recommend a prescription medicine to help prevent HIV infection. If you choose to take medicine to prevent HIV, you should first get tested for HIV. You should then be tested every 3 months for as long as you are taking the medicine. Follow these instructions at home: Alcohol use Do not drink alcohol if your health care provider tells you not to drink. If you drink alcohol: Limit how much you have to 0-2 drinks a day. Know  how much alcohol is in your drink. In the [place] one drink equals one 12 oz bottle of beer (355 mL), one 5 oz glass of wine (148 mL), or one 1 oz glass of hard liquor (44 mL). Lifestyle Do not use any products that contain nicotine or tobacco. These products include cigarettes, chewing tobacco, and vaping devices, such as e-cigarettes. If you need help quitting, ask your health care provider. Do not use street drugs. Do not share needles. Ask your health care provider for help if you need support or information about quitting drugs. General instructions Schedule regular health, dental, and eye exams. Stay current with your vaccines. Tell your health care provider if: You often feel depressed. You have ever been abused or do not feel safe at home. Summary Adopting a healthy lifestyle and getting preventive care are important in promoting health and wellness. Follow your health care provider's instructions about healthy diet, exercising, and getting tested or screened for diseases. Follow your health care provider's instructions on monitoring your cholesterol and blood pressure. This information is not intended to replace advice given to you by your health care provider. Make sure you discuss any questions you have with your health care provider. Document Revised: 11/18/2020 Document Reviewed: 11/18/2020 Elsevier Patient Education  2024 [employer].  Edwina Barth, MD [facility]

## 2023-06-23 NOTE — Assessment & Plan Note (Addendum)
Mostly in the morning when he wakes up.  Improves during the day Localized to left upper trapezius area Sleeps on that particular side Pain related to activities of daily life.  Mechanical in nature. Pain management discussed

## 2023-06-23 NOTE — Assessment & Plan Note (Signed)
Clinically stable. Takes pantoprazole 40 mg as needed Diet and nutrition discussed Foods to avoid discussed

## 2023-06-23 NOTE — Assessment & Plan Note (Signed)
Abnormal lipid profile Cardiovascular risks associated with dyslipidemia discussed Diet and nutrition discussed Continue simvastatin 20 mg daily

## 2023-06-23 NOTE — Patient Instructions (Signed)
Health Maintenance, Male Adopting a healthy lifestyle and getting preventive care are important in promoting health and wellness. Ask your health care provider about: The right schedule for you to have regular tests and exams. Things you can do on your own to prevent diseases and keep yourself healthy. What should I know about diet, weight, and exercise? Eat a healthy diet  Eat a diet that includes plenty of vegetables, fruits, low-fat dairy products, and lean protein. Do not eat a lot of foods that are high in solid fats, added sugars, or sodium. Maintain a healthy weight Body mass index (BMI) is a measurement that can be used to identify possible weight problems. It estimates body fat based on height and weight. Your health care provider can help determine your BMI and help you achieve or maintain a healthy weight. Get regular exercise Get regular exercise. This is one of the most important things you can do for your health. Most adults should: Exercise for at least 150 minutes each week. The exercise should increase your heart rate and make you sweat (moderate-intensity exercise). Do strengthening exercises at least twice a week. This is in addition to the moderate-intensity exercise. Spend less time sitting. Even light physical activity can be beneficial. Watch cholesterol and blood lipids Have your blood tested for lipids and cholesterol at 55 years of age, then have this test every 5 years. You may need to have your cholesterol levels checked more often if: Your lipid or cholesterol levels are high. You are older than 55 years of age. You are at high risk for heart disease. What should I know about cancer screening? Many types of cancers can be detected early and may often be prevented. Depending on your health history and family history, you may need to have cancer screening at various ages. This may include screening for: Colorectal cancer. Prostate cancer. Skin cancer. Lung  cancer. What should I know about heart disease, diabetes, and high blood pressure? Blood pressure and heart disease High blood pressure causes heart disease and increases the risk of stroke. This is more likely to develop in people who have high blood pressure readings or are overweight. Talk with your health care provider about your target blood pressure readings. Have your blood pressure checked: Every 3-5 years if you are 18-39 years of age. Every year if you are 40 years old or older. If you are between the ages of 65 and 75 and are a current or former smoker, ask your health care provider if you should have a one-time screening for abdominal aortic aneurysm (AAA). Diabetes Have regular diabetes screenings. This checks your fasting blood sugar level. Have the screening done: Once every three years after age 45 if you are at a normal weight and have a low risk for diabetes. More often and at a younger age if you are overweight or have a high risk for diabetes. What should I know about preventing infection? Hepatitis B If you have a higher risk for hepatitis B, you should be screened for this virus. Talk with your health care provider to find out if you are at risk for hepatitis B infection. Hepatitis C Blood testing is recommended for: Everyone born from 1945 through 1965. Anyone with known risk factors for hepatitis C. Sexually transmitted infections (STIs) You should be screened each year for STIs, including gonorrhea and chlamydia, if: You are sexually active and are younger than 55 years of age. You are older than 55 years of age and your   health care provider tells you that you are at risk for this type of infection. Your sexual activity has changed since you were last screened, and you are at increased risk for chlamydia or gonorrhea. Ask your health care provider if you are at risk. Ask your health care provider about whether you are at high risk for HIV. Your health care provider  may recommend a prescription medicine to help prevent HIV infection. If you choose to take medicine to prevent HIV, you should first get tested for HIV. You should then be tested every 3 months for as long as you are taking the medicine. Follow these instructions at home: Alcohol use Do not drink alcohol if your health care provider tells you not to drink. If you drink alcohol: Limit how much you have to 0-2 drinks a day. Know how much alcohol is in your drink. In the [place] one drink equals one 12 oz bottle of beer (355 mL), one 5 oz glass of wine (148 mL), or one 1 oz glass of hard liquor (44 mL). Lifestyle Do not use any products that contain nicotine or tobacco. These products include cigarettes, chewing tobacco, and vaping devices, such as e-cigarettes. If you need help quitting, ask your health care provider. Do not use street drugs. Do not share needles. Ask your health care provider for help if you need support or information about quitting drugs. General instructions Schedule regular health, dental, and eye exams. Stay current with your vaccines. Tell your health care provider if: You often feel depressed. You have ever been abused or do not feel safe at home. Summary Adopting a healthy lifestyle and getting preventive care are important in promoting health and wellness. Follow your health care provider's instructions about healthy diet, exercising, and getting tested or screened for diseases. Follow your health care provider's instructions on monitoring your cholesterol and blood pressure. This information is not intended to replace advice given to you by your health care provider. Make sure you discuss any questions you have with your health care provider. Document Revised: 11/18/2020 Document Reviewed: 11/18/2020 Elsevier Patient Education  2024 [employer]  

## 2023-06-23 NOTE — Assessment & Plan Note (Signed)
Well-controlled.  No concerns.

## 2023-10-25 DIAGNOSIS — H5213 Myopia, bilateral: Secondary | ICD-10-CM | POA: Diagnosis not present

## 2024-01-17 DIAGNOSIS — M25562 Pain in left knee: Secondary | ICD-10-CM | POA: Diagnosis not present

## 2024-06-22 ENCOUNTER — Encounter: Payer: Self-pay | Admitting: Emergency Medicine

## 2024-06-22 ENCOUNTER — Ambulatory Visit: Payer: BC Managed Care – PPO | Admitting: Emergency Medicine

## 2024-06-22 ENCOUNTER — Ambulatory Visit: Payer: Self-pay | Admitting: Emergency Medicine

## 2024-06-22 VITALS — BP 124/80 | HR 79 | Temp 97.9°F | Ht 68.0 in | Wt 211.0 lb

## 2024-06-22 DIAGNOSIS — E785 Hyperlipidemia, unspecified: Secondary | ICD-10-CM

## 2024-06-22 DIAGNOSIS — Z130 Encounter for screening for diseases of the blood and blood-forming organs and certain disorders involving the immune mechanism: Secondary | ICD-10-CM | POA: Diagnosis not present

## 2024-06-22 DIAGNOSIS — Z125 Encounter for screening for malignant neoplasm of prostate: Secondary | ICD-10-CM

## 2024-06-22 DIAGNOSIS — M549 Dorsalgia, unspecified: Secondary | ICD-10-CM

## 2024-06-22 DIAGNOSIS — Z1329 Encounter for screening for other suspected endocrine disorder: Secondary | ICD-10-CM | POA: Diagnosis not present

## 2024-06-22 DIAGNOSIS — G4733 Obstructive sleep apnea (adult) (pediatric): Secondary | ICD-10-CM

## 2024-06-22 DIAGNOSIS — K219 Gastro-esophageal reflux disease without esophagitis: Secondary | ICD-10-CM | POA: Diagnosis not present

## 2024-06-22 DIAGNOSIS — Z0001 Encounter for general adult medical examination with abnormal findings: Secondary | ICD-10-CM

## 2024-06-22 DIAGNOSIS — Z0000 Encounter for general adult medical examination without abnormal findings: Secondary | ICD-10-CM | POA: Diagnosis not present

## 2024-06-22 DIAGNOSIS — Z13228 Encounter for screening for other metabolic disorders: Secondary | ICD-10-CM | POA: Diagnosis not present

## 2024-06-22 LAB — COMPREHENSIVE METABOLIC PANEL WITH GFR
ALT: 20 U/L (ref 0–53)
AST: 17 U/L (ref 0–37)
Albumin: 4.6 g/dL (ref 3.5–5.2)
Alkaline Phosphatase: 56 U/L (ref 39–117)
BUN: 10 mg/dL (ref 6–23)
CO2: 30 meq/L (ref 19–32)
Calcium: 9.4 mg/dL (ref 8.4–10.5)
Chloride: 104 meq/L (ref 96–112)
Creatinine, Ser: 0.93 mg/dL (ref 0.40–1.50)
GFR: 91.84 mL/min (ref >60.00)
Glucose, Bld: 84 mg/dL (ref 70–99)
Potassium: 4.2 meq/L (ref 3.5–5.1)
Sodium: 140 meq/L (ref 135–145)
Total Bilirubin: 0.7 mg/dL (ref 0.2–1.2)
Total Protein: 7.4 g/dL (ref 6.0–8.3)

## 2024-06-22 LAB — LIPID PANEL
Cholesterol: 146 mg/dL (ref 0–200)
HDL: 46.1 mg/dL (ref >39.00)
LDL Cholesterol: 73 mg/dL (ref 0–99)
NonHDL: 100.1
Total CHOL/HDL Ratio: 3
Triglycerides: 134 mg/dL (ref 0.0–149.0)
VLDL: 26.8 mg/dL (ref 0.0–40.0)

## 2024-06-22 LAB — CBC WITH DIFFERENTIAL/PLATELET
Basophils Absolute: 0 K/uL (ref 0.0–0.1)
Basophils Relative: 0.9 % (ref 0.0–3.0)
Eosinophils Absolute: 0.2 K/uL (ref 0.0–0.7)
Eosinophils Relative: 4.2 % (ref 0.0–5.0)
HCT: 42 % (ref 39.0–52.0)
Hemoglobin: 13.9 g/dL (ref 13.0–17.0)
Lymphocytes Relative: 35.2 % (ref 12.0–46.0)
Lymphs Abs: 1.3 K/uL (ref 0.7–4.0)
MCHC: 33 g/dL (ref 30.0–36.0)
MCV: 85.4 fl (ref 78.0–100.0)
Monocytes Absolute: 0.3 K/uL (ref 0.1–1.0)
Monocytes Relative: 8 % (ref 3.0–12.0)
Neutro Abs: 2 K/uL (ref 1.4–7.7)
Neutrophils Relative %: 51.7 % (ref 43.0–77.0)
Platelets: 216 K/uL (ref 150.0–400.0)
RBC: 4.92 Mil/uL (ref 4.22–5.81)
RDW: 13.9 % (ref 11.5–15.5)
WBC: 3.8 K/uL — ABNORMAL LOW (ref 4.0–10.5)

## 2024-06-22 LAB — HEMOGLOBIN A1C: Hgb A1c MFr Bld: 6 % (ref 4.6–6.5)

## 2024-06-22 LAB — PSA: PSA: 0.45 ng/mL (ref 0.10–4.00)

## 2024-06-22 NOTE — Progress Notes (Signed)
 [name] 58 y.o.   Chief Complaint  Patient presents with   Annual Exam    No concerns    HISTORY OF PRESENT ILLNESS: This is a 58 y.o. male here for annual exam and follow-up on chronic medical conditions Here with interpreter. Overall doing well.  Has occasional low back pain No other complaints or medical concerns today.  HPI   Prior to Admission medications  Medication Sig Start Date End Date Taking? Authorizing Provider  simvastatin  (ZOCOR ) 20 MG tablet TAKE 1 TABLET BY MOUTH EVERY DAY EVERY EVENING 06/27/20  Yes Purcell Emil Schanz, MD    Allergies[1]  Patient Active Problem List   Diagnosis Date Noted   Musculoskeletal back pain 06/23/2023   GERD (gastroesophageal reflux disease)    Dyslipidemia 12/30/2020   History of gastroesophageal reflux (GERD) 12/30/2020   History of sleep apnea 12/30/2020   OSA (obstructive sleep apnea) 06/27/2015   Carpal tunnel syndrome, bilateral 04/29/2012    Past Medical History:  Diagnosis Date   GERD (gastroesophageal reflux disease)     on meds   High cholesterol    Hyperlipidemia    on meds   Sleep apnea    uses CPAP    Past Surgical History:  Procedure Laterality Date   NO PAST SURGERIES      Social History   Socioeconomic History   Marital status: Married    Spouse name: Not on file   Number of children: Not on file   Years of education: Not on file   Highest education level: Not on file  Occupational History   Not on file  Tobacco Use   Smoking status: Former    Current packs/day: 0.00    Types: Cigarettes    Quit date: 11/30/2006    Years since quitting: 17.5   Smokeless tobacco: Never  Vaping Use   Vaping status: Never Used  Substance and Sexual Activity   Alcohol use: Not Currently   Drug use: No   Sexual activity: Not on file  Other Topics Concern   Not on file  Social History Narrative   ** Merged History Encounter **       Social Drivers of Health   Tobacco Use: Medium Risk  (06/22/2024)   Patient History    Smoking Tobacco Use: Former    Smokeless Tobacco Use: Never    Passive Exposure: Not on Actuary Strain: Not on file  Food Insecurity: Not on file  Transportation Needs: Not on file  Physical Activity: Not on file  Stress: Not on file  Social Connections: Not on file  Intimate Partner Violence: Not on file  Depression (PHQ2-9): Low Risk (06/23/2023)   Depression (PHQ2-9)    PHQ-2 Score: 0  Alcohol Screen: Not on file  Housing: Not on file  Utilities: Not on file  Health Literacy: Not on file    Family History  Problem Relation Age of Onset   Thyroid  disease Mother    Hyperlipidemia Mother    Stroke Mother    Hyperlipidemia Father    Hypertension Father    Stroke Father    Colon polyps Neg Hx    Colon cancer Neg Hx    Esophageal cancer Neg Hx    Rectal cancer Neg Hx    Stomach cancer Neg Hx      Review of Systems  Constitutional: Negative.  Negative for chills and fever.  HENT: Negative.  Negative for congestion and sore throat.   Respiratory: Negative.  Negative for cough and shortness of breath.   Cardiovascular: Negative.  Negative for chest pain and palpitations.  Gastrointestinal:  Negative for abdominal pain, diarrhea, nausea and vomiting.  Genitourinary: Negative.  Negative for dysuria and hematuria.  Musculoskeletal:  Positive for back pain.  Skin: Negative.  Negative for rash.  Neurological: Negative.  Negative for dizziness and headaches.  All other systems reviewed and are negative.   Vitals:   06/22/24 0852  BP: 124/80  Pulse: 79  Temp: 97.9 F (36.6 C)  SpO2: 97%    Physical Exam Vitals reviewed.  Constitutional:      Appearance: Normal appearance.  HENT:     Head: Normocephalic.     Mouth/Throat:     Mouth: Mucous membranes are moist.     Pharynx: Oropharynx is clear.  Eyes:     Extraocular Movements: Extraocular movements intact.     Conjunctiva/sclera: Conjunctivae normal.      Pupils: Pupils are equal, round, and reactive to light.  Cardiovascular:     Rate and Rhythm: Normal rate and regular rhythm.     Pulses: Normal pulses.     Heart sounds: Normal heart sounds.  Pulmonary:     Effort: Pulmonary effort is normal.     Breath sounds: Normal breath sounds.  Abdominal:     Palpations: Abdomen is soft.     Tenderness: There is no abdominal tenderness.  Musculoskeletal:     Cervical back: No tenderness.  Lymphadenopathy:     Cervical: No cervical adenopathy.  Skin:    General: Skin is warm and dry.     Capillary Refill: Capillary refill takes less than 2 seconds.  Neurological:     General: No focal deficit present.     Mental Status: He is alert and oriented to person, place, and time.  Psychiatric:        Mood and Affect: Mood normal.        Behavior: Behavior normal.      ASSESSMENT & PLAN: Problem List Items Addressed This Visit       Respiratory   OSA (obstructive sleep apnea)   Well-controlled. No concerns.       Relevant Orders   CBC with Differential/Platelet     Digestive   GERD (gastroesophageal reflux disease)   Clinically stable. Takes pantoprazole  40 mg as needed Diet and nutrition discussed Foods to avoid discussed      Relevant Orders   CBC with Differential/Platelet     Other   Dyslipidemia   Abnormal lipid profile Cardiovascular risks associated with dyslipidemia discussed Diet and nutrition discussed Continue simvastatin  20 mg daily      Relevant Orders   Comprehensive metabolic panel with GFR   Lipid panel   Musculoskeletal back pain   Mostly in the morning when he wakes up.  Improves during the day Localized to lumbar area Pain related to activities of daily life.  Mechanical in nature. Pain management discussed      Other Visit Diagnoses       Encounter for general adult medical examination with abnormal findings    -  Primary   Relevant Orders   Comprehensive metabolic panel with GFR   CBC with  Differential/Platelet   Hemoglobin A1c   Lipid panel   PSA     Screening for deficiency anemia       Relevant Orders   CBC with Differential/Platelet     Screening for endocrine, metabolic and immunity disorder  Relevant Orders   Comprehensive metabolic panel with GFR   Hemoglobin A1c     Screening for prostate cancer       Relevant Orders   PSA      Modifiable risk factors discussed with patient. Anticipatory guidance according to age provided. The following topics were also discussed: Social Determinants of Health Smoking.  Non-smoker Diet and nutrition Benefits of exercise Cancer screening and review of colonoscopy report from 2021 Vaccinations review and recommendations Cardiovascular risk assessment Review of chronic medical conditions under management Review of all medications Mental health including depression and anxiety Fall and accident prevention  Patient Instructions  Health Maintenance, Male Adopting a healthy lifestyle and getting preventive care are important in promoting health and wellness. Ask your health care provider about: The right schedule for you to have regular tests and exams. Things you can do on your own to prevent diseases and keep yourself healthy. What should I know about diet, weight, and exercise? Eat a healthy diet  Eat a diet that includes plenty of vegetables, fruits, low-fat dairy products, and lean protein. Do not eat a lot of foods that are high in solid fats, added sugars, or sodium. Maintain a healthy weight Body mass index (BMI) is a measurement that can be used to identify possible weight problems. It estimates body fat based on height and weight. Your health care provider can help determine your BMI and help you achieve or maintain a healthy weight. Get regular exercise Get regular exercise. This is one of the most important things you can do for your health. Most adults should: Exercise for at least 150 minutes each week.  The exercise should increase your heart rate and make you sweat (moderate-intensity exercise). Do strengthening exercises at least twice a week. This is in addition to the moderate-intensity exercise. Spend less time sitting. Even light physical activity can be beneficial. Watch cholesterol and blood lipids Have your blood tested for lipids and cholesterol at 56 years of age, then have this test every 5 years. You may need to have your cholesterol levels checked more often if: Your lipid or cholesterol levels are high. You are older than 56 years of age. You are at high risk for heart disease. What should I know about cancer screening? Many types of cancers can be detected early and may often be prevented. Depending on your health history and family history, you may need to have cancer screening at various ages. This may include screening for: Colorectal cancer. Prostate cancer. Skin cancer. Lung cancer. What should I know about heart disease, diabetes, and high blood pressure? Blood pressure and heart disease High blood pressure causes heart disease and increases the risk of stroke. This is more likely to develop in people who have high blood pressure readings or are overweight. Talk with your health care provider about your target blood pressure readings. Have your blood pressure checked: Every 3-5 years if you are 24-41 years of age. Every year if you are 23 years old or older. If you are between the ages of 53 and 85 and are a current or former smoker, ask your health care provider if you should have a one-time screening for abdominal aortic aneurysm (AAA). Diabetes Have regular diabetes screenings. This checks your fasting blood sugar level. Have the screening done: Once every three years after age 44 if you are at a normal weight and have a low risk for diabetes. More often and at a younger age if you are overweight or  have a high risk for diabetes. What should I know about  preventing infection? Hepatitis B If you have a higher risk for hepatitis B, you should be screened for this virus. Talk with your health care provider to find out if you are at risk for hepatitis B infection. Hepatitis C Blood testing is recommended for: Everyone born from [birth year] through 1963. Anyone with known risk factors for hepatitis C. Sexually transmitted infections (STIs) You should be screened each year for STIs, including gonorrhea and chlamydia, if: You are sexually active and are younger than 56 years of age. You are older than 56 years of age and your health care provider tells you that you are at risk for this type of infection. Your sexual activity has changed since you were last screened, and you are at increased risk for chlamydia or gonorrhea. Ask your health care provider if you are at risk. Ask your health care provider about whether you are at high risk for HIV. Your health care provider may recommend a prescription medicine to help prevent HIV infection. If you choose to take medicine to prevent HIV, you should first get tested for HIV. You should then be tested every 3 months for as long as you are taking the medicine. Follow these instructions at home: Alcohol use Do not drink alcohol if your health care provider tells you not to drink. If you drink alcohol: Limit how much you have to 0-2 drinks a day. Know how much alcohol is in your drink. In the [place] one drink equals one 12 oz bottle of beer (355 mL), one 5 oz glass of wine (148 mL), or one 1 oz glass of hard liquor (44 mL). Lifestyle Do not use any products that contain nicotine or tobacco. These products include cigarettes, chewing tobacco, and vaping devices, such as e-cigarettes. If you need help quitting, ask your health care provider. Do not use street drugs. Do not share needles. Ask your health care provider for help if you need support or information about quitting drugs. General instructions Schedule  regular health, dental, and eye exams. Stay current with your vaccines. Tell your health care provider if: You often feel depressed. You have ever been abused or do not feel safe at home. Summary Adopting a healthy lifestyle and getting preventive care are important in promoting health and wellness. Follow your health care provider's instructions about healthy diet, exercising, and getting tested or screened for diseases. Follow your health care provider's instructions on monitoring your cholesterol and blood pressure. This information is not intended to replace advice given to you by your health care provider. Make sure you discuss any questions you have with your health care provider. Document Revised: 11/18/2020 Document Reviewed: 11/18/2020 Elsevier Patient Education  2024 [employer]      Emil Schaumann, MD [facility]    [1] No Known Allergies

## 2024-06-22 NOTE — Assessment & Plan Note (Signed)
 Abnormal lipid profile Cardiovascular risks associated with dyslipidemia discussed Diet and nutrition discussed Continue simvastatin 20 mg daily

## 2024-06-22 NOTE — Assessment & Plan Note (Signed)
 Mostly in the morning when he wakes up.  Improves during the day Localized to lumbar area Pain related to activities of daily life.  Mechanical in nature. Pain management discussed

## 2024-06-22 NOTE — Patient Instructions (Signed)
 Health Maintenance, Male  Adopting a healthy lifestyle and getting preventive care are important in promoting health and wellness. Ask your health care provider about:  The right schedule for you to have regular tests and exams.  Things you can do on your own to prevent diseases and keep yourself healthy.  What should I know about diet, weight, and exercise?  Eat a healthy diet    Eat a diet that includes plenty of vegetables, fruits, low-fat dairy products, and lean protein.  Do not eat a lot of foods that are high in solid fats, added sugars, or sodium.  Maintain a healthy weight  Body mass index (BMI) is a measurement that can be used to identify possible weight problems. It estimates body fat based on height and weight. Your health care provider can help determine your BMI and help you achieve or maintain a healthy weight.  Get regular exercise  Get regular exercise. This is one of the most important things you can do for your health. Most adults should:  Exercise for at least 150 minutes each week. The exercise should increase your heart rate and make you sweat (moderate-intensity exercise).  Do strengthening exercises at least twice a week. This is in addition to the moderate-intensity exercise.  Spend less time sitting. Even light physical activity can be beneficial.  Watch cholesterol and blood lipids  Have your blood tested for lipids and cholesterol at 56 years of age, then have this test every 5 years.  You may need to have your cholesterol levels checked more often if:  Your lipid or cholesterol levels are high.  You are older than 56 years of age.  You are at high risk for heart disease.  What should I know about cancer screening?  Many types of cancers can be detected early and may often be prevented. Depending on your health history and family history, you may need to have cancer screening at various ages. This may include screening for:  Colorectal cancer.  Prostate cancer.  Skin cancer.  Lung  cancer.  What should I know about heart disease, diabetes, and high blood pressure?  Blood pressure and heart disease  High blood pressure causes heart disease and increases the risk of stroke. This is more likely to develop in people who have high blood pressure readings or are overweight.  Talk with your health care provider about your target blood pressure readings.  Have your blood pressure checked:  Every 3-5 years if you are 24-52 years of age.  Every year if you are 3 years old or older.  If you are between the ages of 60 and 72 and are a current or former smoker, ask your health care provider if you should have a one-time screening for abdominal aortic aneurysm (AAA).  Diabetes  Have regular diabetes screenings. This checks your fasting blood sugar level. Have the screening done:  Once every three years after age 66 if you are at a normal weight and have a low risk for diabetes.  More often and at a younger age if you are overweight or have a high risk for diabetes.  What should I know about preventing infection?  Hepatitis B  If you have a higher risk for hepatitis B, you should be screened for this virus. Talk with your health care provider to find out if you are at risk for hepatitis B infection.  Hepatitis C  Blood testing is recommended for:  Everyone born from [birth year] through 1963.  Anyone  with known risk factors for hepatitis C.  Sexually transmitted infections (STIs)  You should be screened each year for STIs, including gonorrhea and chlamydia, if:  You are sexually active and are younger than 56 years of age.  You are older than 56 years of age and your health care provider tells you that you are at risk for this type of infection.  Your sexual activity has changed since you were last screened, and you are at increased risk for chlamydia or gonorrhea. Ask your health care provider if you are at risk.  Ask your health care provider about whether you are at high risk for HIV. Your health care provider  may recommend a prescription medicine to help prevent HIV infection. If you choose to take medicine to prevent HIV, you should first get tested for HIV. You should then be tested every 3 months for as long as you are taking the medicine.  Follow these instructions at home:  Alcohol use  Do not drink alcohol if your health care provider tells you not to drink.  If you drink alcohol:  Limit how much you have to 0-2 drinks a day.  Know how much alcohol is in your drink. In the [place] one drink equals one 12 oz bottle of beer (355 mL), one 5 oz glass of wine (148 mL), or one 1 oz glass of hard liquor (44 mL).  Lifestyle  Do not use any products that contain nicotine or tobacco. These products include cigarettes, chewing tobacco, and vaping devices, such as e-cigarettes. If you need help quitting, ask your health care provider.  Do not use street drugs.  Do not share needles.  Ask your health care provider for help if you need support or information about quitting drugs.  General instructions  Schedule regular health, dental, and eye exams.  Stay current with your vaccines.  Tell your health care provider if:  You often feel depressed.  You have ever been abused or do not feel safe at home.  Summary  Adopting a healthy lifestyle and getting preventive care are important in promoting health and wellness.  Follow your health care provider's instructions about healthy diet, exercising, and getting tested or screened for diseases.  Follow your health care provider's instructions on monitoring your cholesterol and blood pressure.  This information is not intended to replace advice given to you by your health care provider. Make sure you discuss any questions you have with your health care provider.  Document Revised: 11/18/2020 Document Reviewed: 11/18/2020  Elsevier Patient Education  2024 [employer].

## 2024-06-22 NOTE — Assessment & Plan Note (Signed)
Well-controlled.  No concerns.

## 2024-06-22 NOTE — Assessment & Plan Note (Signed)
 Clinically stable. Takes pantoprazole 40 mg as needed Diet and nutrition discussed Foods to avoid discussed

## 2024-08-01 ENCOUNTER — Ambulatory Visit: Admitting: Emergency Medicine
